# Patient Record
Sex: Female | Born: 1955 | Race: White | Hispanic: No | Marital: Married | Smoking: Former smoker
Health system: Southern US, Community
[De-identification: ages and names within clinical notes are randomized; demographics above are authoritative.]

## PROBLEM LIST (undated history)

## (undated) DIAGNOSIS — R51 Headache: Secondary | ICD-10-CM

## (undated) DIAGNOSIS — R112 Nausea with vomiting, unspecified: Secondary | ICD-10-CM

## (undated) DIAGNOSIS — Z9889 Other specified postprocedural states: Secondary | ICD-10-CM

## (undated) DIAGNOSIS — R011 Cardiac murmur, unspecified: Secondary | ICD-10-CM

## (undated) DIAGNOSIS — T8859XA Other complications of anesthesia, initial encounter: Secondary | ICD-10-CM

## (undated) DIAGNOSIS — G2581 Restless legs syndrome: Secondary | ICD-10-CM

## (undated) DIAGNOSIS — F32A Depression, unspecified: Secondary | ICD-10-CM

## (undated) DIAGNOSIS — D649 Anemia, unspecified: Secondary | ICD-10-CM

## (undated) DIAGNOSIS — K219 Gastro-esophageal reflux disease without esophagitis: Secondary | ICD-10-CM

## (undated) DIAGNOSIS — J449 Chronic obstructive pulmonary disease, unspecified: Secondary | ICD-10-CM

## (undated) DIAGNOSIS — T7840XA Allergy, unspecified, initial encounter: Secondary | ICD-10-CM

## (undated) DIAGNOSIS — A630 Anogenital (venereal) warts: Secondary | ICD-10-CM

## (undated) DIAGNOSIS — R0683 Snoring: Secondary | ICD-10-CM

## (undated) DIAGNOSIS — F419 Anxiety disorder, unspecified: Secondary | ICD-10-CM

## (undated) DIAGNOSIS — F329 Major depressive disorder, single episode, unspecified: Secondary | ICD-10-CM

## (undated) HISTORY — DX: Snoring: R06.83

## (undated) HISTORY — PX: OTHER SURGICAL HISTORY: SHX169

## (undated) HISTORY — DX: Anogenital (venereal) warts: A63.0

## (undated) HISTORY — DX: Gastro-esophageal reflux disease without esophagitis: K21.9

## (undated) HISTORY — DX: Restless legs syndrome: G25.81

## (undated) HISTORY — DX: Chronic obstructive pulmonary disease, unspecified: J44.9

## (undated) HISTORY — PX: COSMETIC SURGERY: SHX468

## (undated) HISTORY — PX: FRACTURE SURGERY: SHX138

## (undated) HISTORY — DX: Allergy, unspecified, initial encounter: T78.40XA

## (undated) HISTORY — DX: Major depressive disorder, single episode, unspecified: F32.9

## (undated) HISTORY — DX: Headache: R51

## (undated) HISTORY — DX: Cardiac murmur, unspecified: R01.1

## (undated) HISTORY — DX: Anxiety disorder, unspecified: F41.9

## (undated) HISTORY — DX: Depression, unspecified: F32.A

## (undated) HISTORY — DX: Anemia, unspecified: D64.9

## (undated) HISTORY — PX: BREAST SURGERY: SHX581

---
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
## 1981-01-27 HISTORY — PX: TUBAL LIGATION: SHX77

## 2005-04-11 ENCOUNTER — Other Ambulatory Visit: Admission: RE | Admit: 2005-04-11 | Discharge: 2005-04-11 | Payer: Self-pay | Admitting: Obstetrics & Gynecology

## 2006-06-01 LAB — HM COLONOSCOPY

## 2007-11-25 ENCOUNTER — Observation Stay (HOSPITAL_COMMUNITY): Admission: EM | Admit: 2007-11-25 | Discharge: 2007-11-26 | Payer: Self-pay | Admitting: Internal Medicine

## 2007-11-25 ENCOUNTER — Encounter: Payer: Self-pay | Admitting: Emergency Medicine

## 2007-11-29 ENCOUNTER — Ambulatory Visit: Payer: Self-pay

## 2010-01-27 LAB — HM PAP SMEAR: HM Pap smear: NORMAL

## 2010-01-27 LAB — HM DIABETES EYE EXAM: HM Diabetic Eye Exam: NORMAL

## 2010-06-11 NOTE — H&P (Signed)
Monica Barnes, SEM                ACCOUNT NO.:  [id number]   MEDICAL RECORD NO.:  [id number]          PATIENT TYPE:  INP   LOCATION:  3705                         FACILITY:  [facility]   PHYSICIAN:  Kerry Hough, MD    DATE OF BIRTH:  May 07, 1955   DATE OF ADMISSION:  11/25/2007  DATE OF DISCHARGE:                              HISTORY & PHYSICAL   CHIEF COMPLAINT:  Chest pain.   HISTORY OF PRESENT ILLNESS:  The patient is a 55 year old white female  with a history of tobacco use, anxiety, and depression, who presents  with substernal chest pain.  She notes she has some associated sweating  and shaking, at the worse this pain with 6/10 and lasted minutes.  She  notes she was able to workout in her normal routine this morning for  approximately 30 minutes on the treadmill without issues of shortness of  breath or chest pain.  She was [place]  [employer], systems [employer] in when she had a sudden onset of chest pain at  4 p.m.  She notes her co-worker gave an aspirin to chew and she sat  down.  Then her boss encouraged her to go with EMS to the outside  hospital ([facility]).  She notes she has had no further pain  at that time or present, she is pain free now.  She notes she has never  had any prior or similar symptoms with either rest or exertion.  She  denies any heart failure symptoms including PND, orthopnea, or dyspnea  on exertion.  She has never had any problems with lower extremity  swelling.   She and her husband both did note that she snores.  Anecdotally, on  review of symptoms, she does note some bright red blood per rectum.  She  says this is only a small amount, only noticed very infrequently.  She  looked this up on the Internet and felt this was not a concern.  She  noticed that this last happened 3 days ago.   PAST MEDICAL HISTORY:  1. Anxiety mostly which she states is due to her job.  2. Depression.  3. History of IUD.   MEDICATIONS:  Prozac unknown dose.  Now, recently started Xanax p.r.n.  which she takes only at work, Systems developer for weight loss which she has been  taking for over a year, multiple vitamins including a multivitamin  daily, vitamin D, E, B, fish oil, iron, and calcium.   ALLERGIES:  PENICILLIN which causes emesis.   SOCIAL HISTORY:  She smokes approximately one-half to one-quarter pack  per day.  Occasional alcohol.  She said there will be nights where she  drinks 3 drinks and many nights where she drinks none.  No other drug  use.  She works as above at the [place].   FAMILY HISTORY:  Mother is living at age 67.  Father died at 57 with  pancreatic cancer.   REVIEW OF SYSTEMS:  A full 14-point review of systems was completed.  Pertinent positives were discussed in the  HPI above.  Of note, she does  note some occasional bright red blood per rectum, but denies any frank  melena.   PHYSICAL EXAMINATION:  VITAL SIGNS:  Blood pressure 152/79 down to  121/76 at present, 59-70 is her pulse, 14-18 is her respiratory rate.  She is afebrile with a temperature maximum 98.1 here.  GENERAL:  No acute distress.  HEENT:  Normocephalic and atraumatic.  Oropharynx clear.  Mucous  membranes moist.  NECK:  No bruit.  No JVD.  LYMPH:  No cervical lymphadenopathy.  CARDIOVASCULAR:  Regular rate and rhythm.  No murmurs, rubs, or gallops.  CHEST:  Clear to auscultation bilaterally.  ABDOMEN:  Soft, nontender, and nondistended.  Normoactive bowel sounds.  EXTREMITIES:  No clubbing, cyanosis, or edema.  Pulses are 2+.  NEUROLOGIC:  Nonfocal.  Strength is 5/5 x4 extremities.  SKIN:  No rash, clean, dry, and intact.  MUSCULOSKELETAL:  No deformities.  Full range of motion.   LABORATORY DATA:  White count is 6.8, hemoglobin is 13.6, and platelet  count is 211.  Sodium is 142, potassium 4.2, chloride 103, bicarb 26,  BUN 16, creatinine 0.7, and glucose 82.  At 18:49, myoglobin was normal.  CK-MB was 1.6 and  troponin was less than 0.05.  EKG revealed normal  sinus rhythm at a rate of 60 with no ST or T-wave changes.  Chest x-ray  was within normal limits.   ASSESSMENT AND PLAN:  A 55 year old white female with anxiety,  depression, positive tobacco use, presents with substernal chest pain.  I am suspicious primarily as a gastrointestinal source, as she seems to  have an atypical story for ACS.  1. Chest pain.  Low risk by history, only noted cardiovascular risk is      her tobacco use.  We will rule out with EKG and serial enzymes.      Continue on telemetry, aspirin 325 mg was given already.  We will      check a fasting lipid profile, hemoglobin A1c.  Exercise stress      test in the morning if her enzymes are negative.  Her blood      pressure is now normal.  If need be, we will add beta-blocker and      ACE inhibitor.  2. Gastrointestinal.  Heme check on her stools.  She does warrant      outpatient followup, likely colonoscopy needed.  3. Anxiety.  Continue with her Prozac and benzodiazepines on a p.r.n.      basis.  4. Prophylaxis.  Proton pump inhibitor and Lovenox.      Kerry Hough, MD  Electronically Signed     PMB/MEDQ  D:  11/26/2007  T:  11/26/2007  Job:  [id number]   cc:   Maisie Fus, M.D.

## 2010-09-03 ENCOUNTER — Ambulatory Visit (INDEPENDENT_AMBULATORY_CARE_PROVIDER_SITE_OTHER): Payer: Managed Care, Other (non HMO) | Admitting: Internal Medicine

## 2010-09-03 ENCOUNTER — Other Ambulatory Visit (INDEPENDENT_AMBULATORY_CARE_PROVIDER_SITE_OTHER): Payer: Managed Care, Other (non HMO)

## 2010-09-03 ENCOUNTER — Encounter: Payer: Self-pay | Admitting: Internal Medicine

## 2010-09-03 VITALS — BP 130/78 | HR 65 | Temp 97.8°F | Resp 16 | Ht 62.0 in | Wt 168.2 lb

## 2010-09-03 DIAGNOSIS — R413 Other amnesia: Secondary | ICD-10-CM | POA: Insufficient documentation

## 2010-09-03 DIAGNOSIS — R0683 Snoring: Secondary | ICD-10-CM

## 2010-09-03 DIAGNOSIS — R51 Headache: Secondary | ICD-10-CM | POA: Insufficient documentation

## 2010-09-03 DIAGNOSIS — R0609 Other forms of dyspnea: Secondary | ICD-10-CM

## 2010-09-03 DIAGNOSIS — G478 Other sleep disorders: Secondary | ICD-10-CM | POA: Insufficient documentation

## 2010-09-03 DIAGNOSIS — R519 Headache, unspecified: Secondary | ICD-10-CM | POA: Insufficient documentation

## 2010-09-03 HISTORY — DX: Snoring: R06.83

## 2010-09-03 LAB — CBC WITH DIFFERENTIAL/PLATELET
Basophils Absolute: 0 10*3/uL (ref 0.0–0.1)
Eosinophils Absolute: 0.2 10*3/uL (ref 0.0–0.7)
HCT: 37.2 % (ref 36.0–46.0)
Hemoglobin: 12.7 g/dL (ref 12.0–15.0)
Lymphs Abs: 0.8 10*3/uL (ref 0.7–4.0)
MCHC: 34.3 g/dL (ref 30.0–36.0)
MCV: 93.5 fl (ref 78.0–100.0)
Monocytes Absolute: 0.4 10*3/uL (ref 0.1–1.0)
Neutro Abs: 5.5 10*3/uL (ref 1.4–7.7)
Platelets: 225 10*3/uL (ref 150.0–400.0)
RDW: 13 % (ref 11.5–14.6)

## 2010-09-03 LAB — COMPREHENSIVE METABOLIC PANEL
ALT: 27 U/L (ref 0–35)
Alkaline Phosphatase: 53 U/L (ref 39–117)
CO2: 26 mEq/L (ref 19–32)
Creatinine, Ser: 0.6 mg/dL (ref 0.4–1.2)
GFR: 109.68 mL/min (ref >60.00)
Total Bilirubin: 0.6 mg/dL (ref 0.3–1.2)

## 2010-09-03 LAB — VITAMIN B12: Vitamin B-12: 725 pg/mL (ref 211–911)

## 2010-09-03 MED ORDER — TOPIRAMATE 25 MG PO TABS: 25.0000 mg | ORAL_TABLET | Freq: Every day | ORAL | Status: DC

## 2010-09-03 NOTE — Progress Notes (Signed)
Subjective:    Patient ID: Monica Barnes, female    DOB: December 16, 1955, 55 y.o.   MRN: [id number]  Headache  This is a new problem. Episode onset: 2 months. The problem occurs intermittently. The problem has been unchanged. The pain is located in the bilateral region. The pain does not radiate. The pain quality is not similar to prior headaches. The quality of the pain is described as aching. The pain is at a severity of 2/10. The pain is mild. Pertinent negatives include no abdominal pain, abnormal behavior, anorexia, back pain, blurred vision, coughing, dizziness, drainage, ear pain, eye pain, eye redness, eye watering, facial sweating, fever, hearing loss, insomnia, loss of balance, muscle aches, nausea, neck pain, numbness, phonophobia, photophobia, rhinorrhea, scalp tenderness, seizures, sinus pressure, sore throat, swollen glands, tingling, tinnitus, visual change, vomiting, weakness or weight loss. The symptoms are aggravated by unknown. She has tried NSAIDs for the symptoms. The treatment provided mild relief. There is no history of cancer, cluster headaches, hypertension, immunosuppression, migraine headaches, migraines in the family, obesity, pseudotumor cerebri, recent head traumas, sinus disease or TMJ.      Review of Systems  Constitutional: Positive for fatigue and unexpected weight change (weight gain). Negative for fever, chills, weight loss, diaphoresis, activity change and appetite change.  HENT: Negative for hearing loss, ear pain, sore throat, facial swelling, rhinorrhea, mouth sores, trouble swallowing, neck pain, neck stiffness, voice change, sinus pressure, tinnitus and ear discharge.   Eyes: Negative for blurred vision, photophobia, pain, discharge, redness, itching and visual disturbance.  Respiratory: Positive for apnea (heavy snoring). Negative for cough, choking, chest tightness, shortness of breath, wheezing and stridor.   Cardiovascular: Negative for chest pain, palpitations  and leg swelling.  Gastrointestinal: Negative for nausea, vomiting, abdominal pain, diarrhea, constipation, blood in stool, abdominal distention, anal bleeding, rectal pain and anorexia.  Genitourinary: Negative for dysuria, urgency, frequency, hematuria, flank pain, enuresis, difficulty urinating and dyspareunia.  Musculoskeletal: Negative for back pain.  Skin: Negative for color change, pallor, rash and wound.  Neurological: Positive for headaches. Negative for dizziness, tingling, tremors, seizures, syncope, facial asymmetry, speech difficulty, weakness, light-headedness, numbness and loss of balance.  Hematological: Negative for adenopathy. Does not bruise/bleed easily.  Psychiatric/Behavioral: Positive for confusion, sleep disturbance and decreased concentration. Negative for suicidal ideas, hallucinations, behavioral problems, self-injury, dysphoric mood and agitation. The patient is nervous/anxious. The patient does not have insomnia.        Objective:   Physical Exam  Vitals reviewed. Constitutional: She is oriented to person, place, and time. She appears well-developed and well-nourished. No distress.  HENT:  Head: Normocephalic and atraumatic.  Right Ear: External ear normal.  Left Ear: External ear normal.  Nose: Nose normal.  Mouth/Throat: Oropharynx is clear and moist. No oropharyngeal exudate.  Eyes: Conjunctivae and EOM are normal. Pupils are equal, round, and reactive to light. Right eye exhibits no discharge. Left eye exhibits no discharge. No scleral icterus.  Neck: Normal range of motion. Neck supple. No JVD present. No tracheal deviation present. No thyromegaly present.  Cardiovascular: Normal rate, regular rhythm, normal heart sounds and intact distal pulses.  Exam reveals no gallop and no friction rub.   No murmur heard. Pulmonary/Chest: Effort normal and breath sounds normal. No stridor. No respiratory distress. She has no wheezes. She has no rales. She exhibits no  tenderness.  Abdominal: Soft. Bowel sounds are normal. She exhibits no distension and no mass. There is no tenderness. There is no rebound and no guarding.  Musculoskeletal: Normal  range of motion. She exhibits no edema and no tenderness.  Lymphadenopathy:    She has no cervical adenopathy.  Neurological: She is alert and oriented to person, place, and time. She has normal strength. She is not disoriented. She displays no atrophy, no tremor and normal reflexes. No cranial nerve deficit or sensory deficit. She exhibits normal muscle tone. She displays a negative Romberg sign. She displays no seizure activity. Coordination and gait normal. She displays no Babinski's sign on the right side. She displays no Babinski's sign on the left side.  Reflex Scores:      Tricep reflexes are 1+ on the right side and 1+ on the left side.      Bicep reflexes are 1+ on the right side and 1+ on the left side.      Brachioradialis reflexes are 1+ on the right side.      Patellar reflexes are 1+ on the right side and 1+ on the left side.      Achilles reflexes are 1+ on the right side and 1+ on the left side. Skin: Skin is warm and dry. No rash noted. She is not diaphoretic. No erythema. No pallor.  Psychiatric: She has a normal mood and affect. Her speech is normal and behavior is normal. Judgment and thought content normal. Cognition and memory are normal.          Assessment & Plan:

## 2010-09-03 NOTE — Assessment & Plan Note (Signed)
I will check some labs today to look for secondary causes of memory loss but I think she needs a sleep evaluation

## 2010-09-03 NOTE — Patient Instructions (Signed)
Headache, General, Without Cause The specific cause of your headache may not have been found today. There are many causes and types of headache. A few common ones are:  Tension headache.  Migraine.   Infections (examples: dental and sinus infections).  Bone and/or joint problems in the neck or jaw.   Depression.  Eye problems.   These headaches are not life threatening.  Headaches can sometimes be diagnosed by a patient history and a physical exam. Sometimes, lab and imaging studies (such as x-ray and/or CT scan) are used to rule out more serious problems. In some cases, a spinal tap (lumbar puncture) may be requested. There are many times when your exam and tests may be normal on the first visit even when there is a serious problem causing your headaches. Because of that, it is very important to follow up with your doctor or local clinic for further evaluation. HOME CARE INSTRUCTIONS  Keep follow-up appointments with your caregiver, or any specialist referral.   Only take over-the-counter or prescription medicines for pain, discomfort, or fever as directed by your caregiver.   Biofeedback, massage, or other relaxation techniques may be helpful.   Ice packs or heat applied to the head and neck can be used. Do this three to four times per day, or as needed.   Call your doctor if you have any questions or concerns.   If you smoke, you should quit.  FINDING OUT THE RESULTS OF TESTS  If a radiology test was performed, a radiologist will review your results.   You will be contacted by the emergency department or your physician if any test results require a change in your treatment plan.   Not all test results may be available during your visit. If your test results are not back during the visit, make an appointment with your caregiver to find out the results. Do not assume everything is normal if you have not heard from your caregiver or the medical facility. It is important for you to  follow up on all of your test results.  SEEK MEDICAL CARE IF:  You develop problems with medications prescribed.   You do not respond to or obtain relief from medications.   You have a change from the usual headache.   You develop nausea or vomiting.  SEEK IMMEDIATE MEDICAL CARE IF:   If your headache becomes severe.   You have an unexplained oral temperature above 100.5, or as your caregiver suggests.   You have a stiff neck.   You have loss of vision.   You have muscular weakness.   You have loss of muscular control.   You develop severe symptoms different from your first symptoms.   You start losing your balance or have trouble walking.   You feel faint or pass out.  MAKE SURE YOU:   Understand these instructions.   Will watch your condition.   Will get help right away if you are not doing well or get worse.  Document Released: 01/13/2005 Document Re-Released: 12/27/2007 Exit[employer] Patient Information 2011 [employer]. 

## 2010-09-03 NOTE — Assessment & Plan Note (Signed)
I am concerned that she has sleep apnea and that is the cause of her headaches so I have asked her to be seen by sleep medicine

## 2010-09-03 NOTE — Assessment & Plan Note (Signed)
She has no localizing symptoms and her exam is normal so I have not ordered an imaging study at this time, I will check labs to look for metabolic causes of her symptoms and await the results of sleep evaluation, in the meantime I will try her on topamax to see if that helps with her headaches

## 2010-09-04 LAB — C-REACTIVE PROTEIN: CRP: 0.5 mg/dL (ref <0.6)

## 2010-09-04 LAB — RPR

## 2010-09-20 ENCOUNTER — Encounter: Payer: Self-pay | Admitting: Pulmonary Disease

## 2010-09-20 ENCOUNTER — Ambulatory Visit (INDEPENDENT_AMBULATORY_CARE_PROVIDER_SITE_OTHER): Payer: Managed Care, Other (non HMO) | Admitting: Pulmonary Disease

## 2010-09-20 VITALS — BP 132/78 | HR 70 | Temp 98.1°F | Ht 62.0 in | Wt 169.0 lb

## 2010-09-20 DIAGNOSIS — G2581 Restless legs syndrome: Secondary | ICD-10-CM

## 2010-09-20 DIAGNOSIS — G4700 Insomnia, unspecified: Secondary | ICD-10-CM

## 2010-09-20 DIAGNOSIS — R0683 Snoring: Secondary | ICD-10-CM

## 2010-09-20 DIAGNOSIS — R0989 Other specified symptoms and signs involving the circulatory and respiratory systems: Secondary | ICD-10-CM

## 2010-09-20 HISTORY — DX: Restless legs syndrome: G25.81

## 2010-09-20 NOTE — Assessment & Plan Note (Signed)
She has snoring, sleep disruption, and daytime sleepiness.  She has a history of depression.  I am concerned she could have sleep apnea.  To further assess will arrange for in lab sleep study.  If she does have sleep apnea, she may be a good candidate for an oral appliance.

## 2010-09-20 NOTE — Assessment & Plan Note (Signed)
She has trouble falling asleep and staying asleep.  Some of this is could be related to sleep apnea.  Some of this is likely related to her mood disorder, and to poor sleep hygiene.  Reviewed proper sleep hygiene techniques.  Will address further after review of her sleep study.

## 2010-09-20 NOTE — Patient Instructions (Signed)
Will schedule sleep test Will call to schedule follow up after sleep test reviewed

## 2010-09-20 NOTE — Assessment & Plan Note (Signed)
She c/o difficulty with legs symptoms that cause trouble falling asleep.  Explained this could be related to sleep apnea.  Will reassess after her sleep study.

## 2010-09-20 NOTE — Progress Notes (Signed)
Subjective:    Patient ID: Monica Barnes, female    DOB: 06/20/1955, 55 y.o.   MRN: [id number]  HPI 55 yo female for sleep evaluation.  She has been feeling tired during the day. She has noticed this for about 3 years.  She has trouble sleeping at night and dreams a lot.  She also snores.  She goes to bed at 1030 pm.  It can take her an hour to fall asleep.  She will be thinking about random thoughts.  She will be woken up when her husband goes to the bathroom, and then will again have trouble falling back to sleep.  She gets out of bed at 1030 am.  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  She does not use anything to help sleep or stay awake.  She gets headaches in the morning.  She does not usually take naps.    She tends to sleep on her side.  She gets funny feeling in her legs several times per week.  This can cause trouble falling asleep.  She does not have leg cramps.  The patient denies sleep walking, sleep talking, bruxism, or nightmares.  There is no history of restless legs.  The patient denies sleep hallucinations, sleep paralysis, or cataplexy.  She has gained about 20 lbs over the past year.  She smokes a few cigarettes per week.  She drinks wine in the evening.  She used to have allergy troubles, but not so much recently.  Epworth score is 4 out of 24.  Past Medical History  Diagnosis Date  . Anemia   . Depression   . Headache   . Heart murmur   . Genital warts   . Asthma      Family History  Problem Relation Age of Onset  . Alcohol abuse Other   . Drug abuse Other   . Throat cancer Father   . Heart disease Maternal Grandmother   . Liver cancer Father   . Alcohol abuse Father   . Hypertension Father   . Emphysema Mother   . Pancreatic cancer Father   . Lung cancer Paternal Uncle   . Prostate cancer Maternal Grandfather      History   Social History  . Marital Status: Married   Occupational History  . 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 Smoking status: Current Everyday Smoker -- 0.3 packs/day for 40 years    Types: Cigarettes  . Alcohol Use: 12.6 oz/week    21 Glasses of wine per week  . Drug Use: No  . Sexually Active: Yes    Birth Control/ Protection: Post-menopausal    Social History Narrative   Caffienated drinks-yesSeat belt use often-yesRegular Exercise-noSmoke alarm in the home-noFirearms/guns in the home-yesHistory of physical abuse-no     Allergies  Allergen Reactions  . Penicillins Nausea And Vomiting    Review of Systems  Constitutional: Positive for appetite change and unexpected weight change.  HENT: Positive for sore throat.   Respiratory: Positive for shortness of breath.   Cardiovascular: Positive for chest pain.  Neurological: Positive for headaches.  Psychiatric/Behavioral: Positive for dysphoric mood.  Heartburn/indigestion      Objective:   Physical Exam  BP 132/78  Pulse 70  Temp(Src) 98.1 F (36.7 C) (Oral)  Ht 5' 2"  (1.575 m)  Wt 169 lb (76.658 kg)  BMI 30.91 kg/m2  SpO2 99%  LMP  01/27/2009  General - Obese HEENT - PERRLA, EOMI, no sinus tenderness, over-bite, MP 3, scalloped tongue border, no LAN, no thyromegaly Cardiac - s1s2 regular, no murmur, pulses symmetric Chest - CTA abd - soft, nontender Ext - no e/c/c Neuro - normal strength, CN intact Psych - anxious Skin - no rashes     Assessment & Plan:   Snoring She has snoring, sleep disruption, and daytime sleepiness.  She has a history of depression.  I am concerned she could have sleep apnea.  To further assess will arrange for in lab sleep study.  If she does have sleep apnea, she may be a good candidate for an oral appliance.  Restless legs She c/o difficulty with legs symptoms that cause trouble falling asleep.  Explained this could be related to sleep apnea.  Will reassess after her sleep study.  Insomnia She has trouble falling asleep and staying asleep.  Some  of this is could be related to sleep apnea.  Some of this is likely related to her mood disorder, and to poor sleep hygiene.  Reviewed proper sleep hygiene techniques.  Will address further after review of her sleep study.    Updated Medication List Outpatient Encounter Prescriptions as of 09/20/2010  Medication Sig Dispense Refill  . ALPRAZolam (XANAX) 0.5 MG tablet Take 0.5 mg by mouth 3 (three) times daily as needed.        . B Complex-C-Folic Acid (STRESS B COMPLEX PO) Once a day       . Carbonyl Iron (PERFECT IRON PO) Once a day       . Cholecalciferol (D3-1000) 1000 UNITS capsule Take 1,000 Units by mouth daily.        . cyanocobalamin 1000 MCG tablet Take 100 mcg by mouth daily.        Marland Kitchen estradiol (ESTRACE) 1 MG tablet Take 1 mg by mouth daily.        Marland Kitchen FLUoxetine (PROZAC) 20 MG capsule Take 20 mg by mouth daily.        Marland Kitchen omeprazole (PRILOSEC OTC) 20 MG tablet Take 20 mg by mouth daily.        Marland Kitchen orlistat (ALLI) 60 MG capsule Take 60 mg by mouth 3 (three) times daily with meals.        Marland Kitchen tetrahydrozoline (VISINE) 0.05 % ophthalmic solution Place 1 drop into both eyes as needed.        . topiramate (TOPAMAX) 25 MG tablet Take 1 tablet (25 mg total) by mouth at bedtime.  30 tablet  2

## 2010-10-09 ENCOUNTER — Ambulatory Visit (HOSPITAL_BASED_OUTPATIENT_CLINIC_OR_DEPARTMENT_OTHER): Payer: Managed Care, Other (non HMO) | Attending: Pulmonary Disease

## 2010-10-09 DIAGNOSIS — R0683 Snoring: Secondary | ICD-10-CM

## 2010-10-09 DIAGNOSIS — Z79899 Other long term (current) drug therapy: Secondary | ICD-10-CM | POA: Insufficient documentation

## 2010-10-09 DIAGNOSIS — G4710 Hypersomnia, unspecified: Secondary | ICD-10-CM | POA: Insufficient documentation

## 2010-10-15 NOTE — Procedures (Signed)
Monica Barnes, Monica Barnes               ACCOUNT NO.:  [id number]  MEDICAL RECORD NO.:  [id number]          PATIENT TYPE:  OUT  LOCATION:  SLEEP CENTER                 FACILITY:  [facility]  PHYSICIAN:  Chesley Mires, MD        DATE OF BIRTH:  March 30, 1955  DATE OF STUDY:  10/09/2010                           NOCTURNAL POLYSOMNOGRAM  REFERRING PHYSICIAN:  Chesley Mires, MD  INDICATIONS:  [name] is a 55 year old female who has a history of depression.  She has difficulty with sleep onset and sleep maintenance insomnia.  She also has symptoms of restless leg syndrome.  She complains of snoring, sleep disruption, and daytime sleepiness.  She is therefore referred to sleep lab for evaluation of hypersomnia and obstructive sleep apnea.  Height is 5 feet 2 inches, weight is 169 pounds.  BMI is 31.  Neck size is 15 inches.  MEDICATIONS:  Xanax, iron, vitamin D, Estrace, Prozac, Prilosec, Visine, and Topamax.  Epworth score is 6.  SLEEP ARCHITECTURE:  Total recording time was 434 minutes.  Total sleep time was 314 minutes.  Sleep efficiency was 70%.  Sleep latency was 65 minutes.  REM latency was 337 minutes.  The study was notable for lack of stage III sleep and the patient slept predominately in the non-supine position.  RESPIRATORY DATA The average respiratory rate was 18.  Occasional snoring was noted by the technician.  The overall apnea/hypopnea index was 0.  However, it appeared that the patient did have truncation of air flow which would be suggestive of upper airway resistance.  OXYGEN DATA:  The baseline oxygenation was 97%.  The oxygen saturation nadir was 91%.  The study was conducted without the use of supplemental oxygen.  CARDIAC DATA:  The average heart rate was 66 and the rhythm strip showed sinus rhythm.  MOVEMENT/PARASOMNIA:  The periodic limb movement index was 92.9 and the patient had no restroom trips.  IMPRESSION:  While this study did not show evidence for  obstructive sleep apnea, she did have truncation of her air flow suggestive of upper airway resistance syndrome.  She had a significant increase in her periodic limb movement index to 92.9.  This would be consistent with her complaints of restless leg syndrome.  In addition to diet, excise, and weight reduction, the patient should have further evaluation of her leg symptoms as this is likely contributing to some degree to her sleep disruption.     Chesley Mires, MD [place], [place] Board of Sleep Medicine    VS/MEDQ  D:  10/15/2010 13:00:18  T:  10/15/2010 21:25:15  Job:  [id number]

## 2010-10-16 ENCOUNTER — Telehealth: Payer: Self-pay | Admitting: Pulmonary Disease

## 2010-10-16 DIAGNOSIS — G4710 Hypersomnia, unspecified: Secondary | ICD-10-CM

## 2010-10-16 DIAGNOSIS — G4730 Sleep apnea, unspecified: Secondary | ICD-10-CM

## 2010-10-16 DIAGNOSIS — G4761 Periodic limb movement disorder: Secondary | ICD-10-CM

## 2010-10-16 NOTE — Telephone Encounter (Signed)
Pt returning call & can be reached at [phone].  Satira Anis

## 2010-10-16 NOTE — Telephone Encounter (Signed)
Pt is coming in tomorrow at 4:30 to discuss these results

## 2010-10-16 NOTE — Telephone Encounter (Signed)
PSG 10/09/10>>AHI 0, SpO2 low 91%, UARS, PLMI 92.9.  Will have my nurse schedule ROV to review results.

## 2010-10-16 NOTE — Telephone Encounter (Signed)
[email or web address]  

## 2010-10-17 ENCOUNTER — Encounter: Payer: Self-pay | Admitting: Pulmonary Disease

## 2010-10-17 ENCOUNTER — Ambulatory Visit (INDEPENDENT_AMBULATORY_CARE_PROVIDER_SITE_OTHER): Payer: Managed Care, Other (non HMO) | Admitting: Pulmonary Disease

## 2010-10-17 ENCOUNTER — Other Ambulatory Visit (INDEPENDENT_AMBULATORY_CARE_PROVIDER_SITE_OTHER): Payer: Managed Care, Other (non HMO)

## 2010-10-17 DIAGNOSIS — G4700 Insomnia, unspecified: Secondary | ICD-10-CM

## 2010-10-17 DIAGNOSIS — R0989 Other specified symptoms and signs involving the circulatory and respiratory systems: Secondary | ICD-10-CM

## 2010-10-17 DIAGNOSIS — R0609 Other forms of dyspnea: Secondary | ICD-10-CM

## 2010-10-17 DIAGNOSIS — D649 Anemia, unspecified: Secondary | ICD-10-CM

## 2010-10-17 DIAGNOSIS — G4710 Hypersomnia, unspecified: Secondary | ICD-10-CM

## 2010-10-17 DIAGNOSIS — R0683 Snoring: Secondary | ICD-10-CM

## 2010-10-17 DIAGNOSIS — G2581 Restless legs syndrome: Secondary | ICD-10-CM

## 2010-10-17 NOTE — Assessment & Plan Note (Signed)
She has symptoms of RLS and PLMI on sleep study was significantly elevated.  This is likely contributing significantly to her sleep troubles.  Will check her iron levels and ferritin to determine if she would benefit from iron supplementation.    If her iron levels are normal, then may need to consider switching from prozac to alternative antidepressant agent.  SSRI medications have been associated with worsening RLS.  May also need to consider adding medication like requip to control her symptoms.

## 2010-10-17 NOTE — Assessment & Plan Note (Signed)
Likely related to sleep disruption from RLS/PLMD and UARS.

## 2010-10-17 NOTE — Progress Notes (Signed)
Subjective:    Patient ID: Monica Barnes, female    DOB: 1955-11-16, 55 y.o.   MRN: [id number]  HPI 55 yo female with sleep disruption.  She is here to review PSG 10/09/10>>AHI 0, SpO2 low 91%, changes of Upper Airway Resistance Syndrome noted, PLMI 92.9.  She continues to have problems with her legs.  Past Medical History  Diagnosis Date  . Anemia   . Depression   . Headache   . Heart murmur   . Genital warts   . Asthma   . Upper airway resistance syndrome 09/03/2010  . Restless legs 09/20/2010     Family History  Problem Relation Age of Onset  . Alcohol abuse Other   . Drug abuse Other   . Throat cancer Father   . Heart disease Maternal Grandmother   . Liver cancer Father   . Alcohol abuse Father   . Hypertension Father   . Emphysema Mother   . Pancreatic cancer Father   . Lung cancer Paternal Uncle   . Prostate cancer Maternal Grandfather      History   Social History  . Marital Status: Married   Occupational History  . CUSTOMER SERV [employer]   Social History Main Topics  . Smoking status: Current Everyday Smoker -- 0.3 packs/day for 40 years    Types: Cigarettes  . Alcohol Use: 12.6 oz/week    21 Glasses of wine per week  . Drug Use: No  . Sexually Active: Yes    Birth Control/ Protection: Post-menopausal    Social History Narrative   Caffienated drinks-yesSeat belt use often-yesRegular Exercise-noSmoke alarm in the home-noFirearms/guns in the home-yesHistory of physical abuse-no     Allergies  Allergen Reactions  . Penicillins Nausea And Vomiting    Review of Systems     Objective:   Physical Exam  BP 138/76  Pulse 88  Temp(Src) 98 F (36.7 C) (Oral)  Ht 5' 2"  (1.575 m)  Wt 166 lb 12.8 oz (75.66 kg)  BMI 30.51 kg/m2  SpO2 98%  LMP 01/27/2009  General - Obese  HEENT - PERRLA, EOMI, no sinus tenderness, over-bite, MP 3, scalloped tongue border, no LAN, no thyromegaly  Cardiac - s1s2 regular, no murmur,  pulses symmetric  Chest - CTA  abd - soft, nontender  Ext - no e/c/c  Neuro - normal strength, CN intact  Psych - anxious  Skin - no rashes     Assessment & Plan:   Restless legs She has symptoms of RLS and PLMI on sleep study was significantly elevated.  This is likely contributing significantly to her sleep troubles.  Will check her iron levels and ferritin to determine if she would benefit from iron supplementation.    If her iron levels are normal, then may need to consider switching from prozac to alternative antidepressant agent.  SSRI medications have been associated with worsening RLS.  May also need to consider adding medication like requip to control her symptoms.  Upper airway resistance syndrome She had evidence for upper airway resistance syndrome, but not obstructive sleep apnea on recent sleep study.  I reviewed her sleep study results.  I explained the importance of weight reduction.    If her symptoms persist she may need to try additional therapy, such as CPAP or oral appliance.  However, insurance coverage for these therapies would be an issue.  Insomnia Likely related to sleep disruption from RLS/PLMD and UARS.  Hypersomnia Related to RLS/PLMD  and UARS.    Updated Medication List Outpatient Encounter Prescriptions as of 10/17/2010  Medication Sig Dispense Refill  . ALPRAZolam (XANAX) 0.5 MG tablet Take 0.5 mg by mouth 3 (three) times daily as needed.        . B Complex-C-Folic Acid (STRESS B COMPLEX PO) Once a day       . Carbonyl Iron (PERFECT IRON PO) Once a day       . Cholecalciferol (D3-1000) 1000 UNITS capsule Take 1,000 Units by mouth daily.        . cyanocobalamin 1000 MCG tablet Take 100 mcg by mouth daily.        Marland Kitchen FLUoxetine (PROZAC) 20 MG capsule Take 20 mg by mouth daily.        Marland Kitchen omeprazole (PRILOSEC OTC) 20 MG tablet Take 20 mg by mouth daily.        Marland Kitchen orlistat (ALLI) 60 MG capsule Take 60 mg by mouth 3 (three) times daily with meals.          Marland Kitchen tetrahydrozoline (VISINE) 0.05 % ophthalmic solution Place 1 drop into both eyes as needed.        . topiramate (TOPAMAX) 25 MG tablet Take 1 tablet (25 mg total) by mouth at bedtime.  30 tablet  2  . DISCONTD: estradiol (ESTRACE) 1 MG tablet Take 1 mg by mouth daily.

## 2010-10-17 NOTE — Progress Notes (Signed)
Addended by: Tommie Sams on: 10/17/2010 04:59 PM   Modules accepted: Orders

## 2010-10-17 NOTE — Assessment & Plan Note (Signed)
Related to RLS/PLMD and UARS.

## 2010-10-17 NOTE — Patient Instructions (Signed)
Lab tests today>>will call with results Follow up in 3 months 

## 2010-10-17 NOTE — Assessment & Plan Note (Signed)
She had evidence for upper airway resistance syndrome, but not obstructive sleep apnea on recent sleep study.  I reviewed her sleep study results.  I explained the importance of weight reduction.    If her symptoms persist she may need to try additional therapy, such as CPAP or oral appliance.  However, insurance coverage for these therapies would be an issue.

## 2010-10-18 LAB — FOLATE: Folate: 16.1 ng/mL (ref >5.9)

## 2010-10-18 LAB — VITAMIN B12: Vitamin B-12: 869 pg/mL (ref 211–911)

## 2010-10-18 LAB — IBC PANEL: Saturation Ratios: 19.3 % — ABNORMAL LOW (ref 20.0–50.0)

## 2010-10-22 ENCOUNTER — Telehealth: Payer: Self-pay | Admitting: Pulmonary Disease

## 2010-10-22 NOTE — Telephone Encounter (Signed)
Anemia panel from 10/17/10 normal.  Left message on pt's voicemail detailing results.  Advised her to d/w PCP about whether she can change anti-depressant regimen to see if this improves RLS/PLMD.  If not, then may need to add dopamine agonist (requip).  Advised her to call back with questions.  Otherwise will review at next ROV.

## 2010-10-29 LAB — DIFFERENTIAL
Basophils Absolute: 0
Basophils Relative: 0
Lymphocytes Relative: 14
Neutro Abs: 5
Neutrophils Relative %: 74

## 2010-10-29 LAB — URINALYSIS, ROUTINE W REFLEX MICROSCOPIC
Nitrite: POSITIVE — AB
Specific Gravity, Urine: 1.022
Urobilinogen, UA: 0.2
pH: 7

## 2010-10-29 LAB — CARDIAC PANEL(CRET KIN+CKTOT+MB+TROPI)
CK, MB: 2.4
CK, MB: 3.2
Relative Index: 1.6
Relative Index: 1.6
Total CK: 151
Total CK: 205 — ABNORMAL HIGH
Troponin I: <0.01
Troponin I: <0.01

## 2010-10-29 LAB — BASIC METABOLIC PANEL
CO2: 26
Calcium: 9.5
Creatinine, Ser: 0.7
GFR calc Af Amer: >60
GFR calc non Af Amer: >60
Glucose, Bld: 82
Sodium: 142

## 2010-10-29 LAB — POCT CARDIAC MARKERS: CKMB, poc: 1.6

## 2010-10-29 LAB — CBC
MCHC: 33.9
RBC: 4.26
RDW: 11.6

## 2010-10-29 LAB — LIPID PANEL
Cholesterol: 185
LDL Cholesterol: 120 — ABNORMAL HIGH
VLDL: 19

## 2010-10-29 LAB — URINE MICROSCOPIC-ADD ON

## 2011-07-04 ENCOUNTER — Other Ambulatory Visit: Payer: Self-pay | Admitting: Internal Medicine

## 2011-11-24 ENCOUNTER — Encounter: Payer: Self-pay | Admitting: Gastroenterology

## 2012-01-01 ENCOUNTER — Encounter: Payer: Self-pay | Admitting: Gastroenterology

## 2012-01-01 ENCOUNTER — Telehealth: Payer: Self-pay | Admitting: *Deleted

## 2012-01-01 ENCOUNTER — Ambulatory Visit (AMBULATORY_SURGERY_CENTER): Payer: Managed Care, Other (non HMO) | Admitting: *Deleted

## 2012-01-01 VITALS — Ht 62.0 in | Wt 169.8 lb

## 2012-01-01 DIAGNOSIS — Z1211 Encounter for screening for malignant neoplasm of colon: Secondary | ICD-10-CM

## 2012-01-01 MED ORDER — NA SULFATE-K SULFATE-MG SULF 17.5-3.13-1.6 GM/177ML PO SOLN: ORAL | Status: DC

## 2012-01-01 NOTE — Progress Notes (Signed)
Pt scheduled for colonoscopy 12/19 with Dr. Deatra Ina.  Last colonoscopy 2008 with Dr. Earlean Shawl. Pt thinks she had polyps but does not know which kind. Release of information form signed and given to Genella Mech, CMA.

## 2012-01-01 NOTE — Telephone Encounter (Signed)
Pt scheduled for colonoscopy 12/19 with Dr. Kaplan.  Last colonoscopy 2008 with Dr. Medoff. Pt thinks she had polyps but does not know which kind. Release of information form signed and given to Robin Stallings, CMA.  

## 2012-01-08 ENCOUNTER — Telehealth: Payer: Self-pay | Admitting: *Deleted

## 2012-01-08 NOTE — Telephone Encounter (Signed)
Records received. On dr Nita Sells desk

## 2012-01-08 NOTE — Telephone Encounter (Signed)
Message copied by Marlowe Kays on Thu Jan 08, 2012  9:31 AM ------      Message from: Mckinley Jewel, AMY L      Created: Thu Jan 08, 2012  9:20 AM       Husband called to see if records received on this pt from Dr. Jennye Boroughs office.      See note.      Can be reached at [phone]      Thanks       Amy :)

## 2012-01-08 NOTE — Telephone Encounter (Signed)
Returned patients call, told him I havent received records yet. Pt to call Dr Aurora Mask office. They did receive my fax of release on the 5th.

## 2012-01-09 NOTE — Telephone Encounter (Signed)
Notes on Dr Nita Sells Desk Review this pts records. Has a colonoscopy with you on 12/19

## 2012-01-12 ENCOUNTER — Telehealth: Payer: Self-pay | Admitting: Gastroenterology

## 2012-01-12 NOTE — Telephone Encounter (Signed)
Pts husband is calling, states pt has a sore throat, cough, and pink eye. They wanted to know if Dr. Arlyce Dice has reviewed the colon report from Dr. Jennye Boroughs office and if she really needs to have the colon done on Thursday. Please advise.

## 2012-01-12 NOTE — Telephone Encounter (Signed)
Pts husband called back wanting to know if she needs to have the colon done. Please advise. See note below.

## 2012-01-12 NOTE — Telephone Encounter (Signed)
2008 colonoscopy was negative. His family history is negative for colorectal cancer she can wait until 2018

## 2012-01-12 NOTE — Telephone Encounter (Signed)
Spoke with husband and he is aware, appt cancelled.

## 2012-01-15 ENCOUNTER — Encounter: Payer: Managed Care, Other (non HMO) | Admitting: Gastroenterology

## 2012-06-09 LAB — HM MAMMOGRAPHY: HM Mammogram: NORMAL

## 2012-06-09 LAB — HM PAP SMEAR: HM Pap smear: NORMAL

## 2013-01-07 ENCOUNTER — Encounter: Payer: Managed Care, Other (non HMO) | Admitting: Internal Medicine

## 2013-01-12 LAB — HM DEXA SCAN

## 2013-01-14 ENCOUNTER — Encounter: Payer: Self-pay | Admitting: Internal Medicine

## 2013-01-14 ENCOUNTER — Ambulatory Visit (INDEPENDENT_AMBULATORY_CARE_PROVIDER_SITE_OTHER): Payer: BC Managed Care – PPO | Admitting: Internal Medicine

## 2013-01-14 ENCOUNTER — Other Ambulatory Visit (INDEPENDENT_AMBULATORY_CARE_PROVIDER_SITE_OTHER): Payer: BC Managed Care – PPO

## 2013-01-14 VITALS — BP 132/88 | HR 74 | Temp 98.0°F | Resp 16 | Ht 62.0 in | Wt 181.0 lb

## 2013-01-14 DIAGNOSIS — Z0000 Encounter for general adult medical examination without abnormal findings: Secondary | ICD-10-CM

## 2013-01-14 DIAGNOSIS — M19049 Primary osteoarthritis, unspecified hand: Secondary | ICD-10-CM | POA: Insufficient documentation

## 2013-01-14 DIAGNOSIS — J45909 Unspecified asthma, uncomplicated: Secondary | ICD-10-CM

## 2013-01-14 DIAGNOSIS — E669 Obesity, unspecified: Secondary | ICD-10-CM | POA: Insufficient documentation

## 2013-01-14 DIAGNOSIS — M19041 Primary osteoarthritis, right hand: Secondary | ICD-10-CM

## 2013-01-14 LAB — URINALYSIS, ROUTINE W REFLEX MICROSCOPIC
Nitrite: NEGATIVE
Total Protein, Urine: NEGATIVE
pH: 6 (ref 5.0–8.0)

## 2013-01-14 LAB — COMPREHENSIVE METABOLIC PANEL
AST: 36 U/L (ref 0–37)
Albumin: 4.2 g/dL (ref 3.5–5.2)
Alkaline Phosphatase: 79 U/L (ref 39–117)
BUN: 17 mg/dL (ref 6–23)
Calcium: 9.2 mg/dL (ref 8.4–10.5)
Chloride: 107 mEq/L (ref 96–112)
Potassium: 3.9 mEq/L (ref 3.5–5.1)
Sodium: 140 mEq/L (ref 135–145)
Total Protein: 7.2 g/dL (ref 6.0–8.3)

## 2013-01-14 LAB — CBC WITH DIFFERENTIAL/PLATELET
Basophils Absolute: 0 10*3/uL (ref 0.0–0.1)
Basophils Relative: 0.3 % (ref 0.0–3.0)
Eosinophils Absolute: 0.4 10*3/uL (ref 0.0–0.7)
Lymphocytes Relative: 17.5 % (ref 12.0–46.0)
MCHC: 34 g/dL (ref 30.0–36.0)
MCV: 91.4 fl (ref 78.0–100.0)
Monocytes Absolute: 0.5 10*3/uL (ref 0.1–1.0)
Neutrophils Relative %: 69.1 % (ref 43.0–77.0)
Platelets: 219 10*3/uL (ref 150.0–400.0)
RBC: 4.15 Mil/uL (ref 3.87–5.11)
RDW: 13 % (ref 11.5–14.6)

## 2013-01-14 LAB — TSH: TSH: 1.36 u[IU]/mL (ref 0.35–5.50)

## 2013-01-14 LAB — LDL CHOLESTEROL, DIRECT: Direct LDL: 137.9 mg/dL

## 2013-01-14 LAB — LIPID PANEL: VLDL: 52.2 mg/dL — ABNORMAL HIGH (ref 0.0–40.0)

## 2013-01-14 MED ORDER — LORCASERIN HCL 10 MG PO TABS: 1.0000 | ORAL_TABLET | Freq: Two times a day (BID) | ORAL | Status: DC

## 2013-01-14 MED ORDER — BUDESONIDE-FORMOTEROL FUMARATE 80-4.5 MCG/ACT IN AERO: 2.0000 | INHALATION_SPRAY | Freq: Two times a day (BID) | RESPIRATORY_TRACT | Status: DC

## 2013-01-14 NOTE — Assessment & Plan Note (Signed)
She will start symbicort for this

## 2013-01-14 NOTE — Progress Notes (Signed)
Subjective:    Patient ID: Monica Barnes, female    DOB: Feb 26, 1955, 57 y.o.   MRN: [id number]  Asthma She complains of wheezing. There is no chest tightness, cough, difficulty breathing, frequent throat clearing, hemoptysis, hoarse voice, shortness of breath or sputum production. This is a chronic problem. The current episode started more than 1 year ago. The problem occurs intermittently. The problem has been unchanged. Pertinent negatives include no appetite change, chest pain, dyspnea on exertion, ear congestion, ear pain, fever, headaches, heartburn, malaise/fatigue, myalgias, nasal congestion, orthopnea, PND, postnasal drip, rhinorrhea, sneezing, sore throat, sweats, trouble swallowing or weight loss. Her symptoms are aggravated by change in weather. Her symptoms are alleviated by nothing. Her past medical history is significant for asthma.      Review of Systems  Constitutional: Positive for unexpected weight change (weight gain). Negative for fever, chills, weight loss, malaise/fatigue, diaphoresis, activity change, appetite change and fatigue.  HENT: Negative.  Negative for ear pain, hoarse voice, postnasal drip, rhinorrhea, sneezing, sore throat, trouble swallowing and voice change.   Eyes: Negative.   Respiratory: Positive for wheezing. Negative for apnea, cough, hemoptysis, sputum production, choking, chest tightness and shortness of breath.   Cardiovascular: Negative.  Negative for chest pain, dyspnea on exertion, palpitations, leg swelling and PND.  Gastrointestinal: Negative.  Negative for heartburn and abdominal pain.  Endocrine: Negative.   Genitourinary: Negative.   Musculoskeletal: Positive for arthralgias (right hand pain (1st metacarpal)). Negative for back pain, gait problem, joint swelling, myalgias, neck pain and neck stiffness.  Skin: Negative.   Allergic/Immunologic: Negative.   Neurological: Negative.  Negative for headaches.  Hematological: Negative.  Negative for  adenopathy. Does not bruise/bleed easily.  Psychiatric/Behavioral: Negative.  Negative for sleep disturbance, dysphoric mood and decreased concentration. The patient is not nervous/anxious.        Objective:   Physical Exam  Vitals reviewed. Constitutional: She is oriented to person, place, and time. She appears well-developed and well-nourished. No distress.  HENT:  Head: Normocephalic and atraumatic.  Mouth/Throat: Oropharynx is clear and moist. No oropharyngeal exudate.  Eyes: Conjunctivae are normal. Right eye exhibits no discharge. Left eye exhibits no discharge. No scleral icterus.  Neck: Normal range of motion. Neck supple. No JVD present. No tracheal deviation present. No thyromegaly present.  Cardiovascular: Normal rate, regular rhythm, normal heart sounds and intact distal pulses.  Exam reveals no gallop and no friction rub.   No murmur heard. Pulmonary/Chest: Effort normal. No accessory muscle usage or stridor. Not tachypneic. No respiratory distress. She has no decreased breath sounds. She has wheezes in the right lower field and the left lower field. She has no rhonchi. She has no rales. She exhibits no tenderness.  Abdominal: Soft. Bowel sounds are normal. She exhibits no distension and no mass. There is no tenderness. There is no rebound and no guarding.  Musculoskeletal: Normal range of motion. She exhibits no edema and no tenderness.       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 She exhibits normal range of motion, no tenderness, no bony tenderness, normal two-point discrimination, normal capillary refill, no laceration and no swelling. Normal sensation noted. Decreased sensation is not present in the ulnar distribution, is not present in the medial distribution and is not present in the radial distribution. Normal strength noted.  Lymphadenopathy:    She has no cervical adenopathy.  Neurological: She is oriented to person, place, and time.  Skin:  Skin is warm and dry. No  rash noted. She is not diaphoretic. No erythema. No pallor.  Psychiatric: She has a normal mood and affect. Her behavior is normal. Judgment and thought content normal.     Lab Results  Component Value Date   WBC 7.0 09/03/2010   HGB 12.7 09/03/2010   HCT 37.2 09/03/2010   PLT 225.0 09/03/2010   GLUCOSE 101* 09/03/2010   CHOL  Value: 185        ATP III CLASSIFICATION:  <200     mg/dL   Desirable  454-098  mg/dL   Borderline High  >=119    mg/dL   High 14/78/2956   TRIG 94 11/26/2007   HDL 46 11/26/2007   LDLCALC  Value: 120        Total Cholesterol/HDL:CHD Risk Coronary Heart Disease Risk Table                     Men   Women  1/2 Average Risk   3.4   3.3* 11/26/2007   ALT 27 09/03/2010   AST 30 09/03/2010   NA 139 09/03/2010   K 4.1 09/03/2010   CL 104 09/03/2010   CREATININE 0.6 09/03/2010   BUN 20 09/03/2010   CO2 26 09/03/2010   TSH 1.11 09/03/2010   HGBA1C  Value: 5.6 (NOTE)   The ADA recommends the following therapeutic goal for glycemic   control related to Hgb A1C measurement:   Goal of Therapy:   < 7.0% Hgb A1C   Reference: American Diabetes Association: Clinical Practice   Recommendations 2008, Diabetes Care,  2008, 31:(Suppl 1). 11/26/2007        Assessment & Plan:

## 2013-01-14 NOTE — Assessment & Plan Note (Signed)
She will start treating this with tylenol

## 2013-01-14 NOTE — Patient Instructions (Signed)
Preventive Care for Adults, Female A healthy lifestyle and preventive care can promote health and wellness. Preventive health guidelines for women include the following key practices.  A routine yearly physical is a good way to check with your caregiver about your health and preventive screening. It is a chance to share any concerns and updates on your health, and to receive a thorough exam.  Visit your dentist for a routine exam and preventive care every 6 months. Brush your teeth twice a day and floss once a day. Good oral hygiene prevents tooth decay and gum disease.  The frequency of eye exams is based on your age, health, family medical history, use of contact lenses, and other factors. Follow your caregiver's recommendations for frequency of eye exams.  Eat a healthy diet. Foods like vegetables, fruits, whole grains, low-fat dairy products, and lean protein foods contain the nutrients you need without too many calories. Decrease your intake of foods high in solid fats, added sugars, and salt. Eat the right amount of calories for you.Get information about a proper diet from your caregiver, if necessary.  Regular physical exercise is one of the most important things you can do for your health. Most adults should get at least 150 minutes of moderate-intensity exercise (any activity that increases your heart rate and causes you to sweat) each week. In addition, most adults need muscle-strengthening exercises on 2 or more days a week.  Maintain a healthy weight. The body mass index (BMI) is a screening tool to identify possible weight problems. It provides an estimate of body fat based on height and weight. Your caregiver can help determine your BMI, and can help you achieve or maintain a healthy weight.For adults 20 years and older:  A BMI below 18.5 is considered underweight.  A BMI of 18.5 to 24.9 is normal.  A BMI of 25 to 29.9 is considered overweight.  A BMI of 30 and above is  considered obese.  Maintain normal blood lipids and cholesterol levels by exercising and minimizing your intake of saturated fat. Eat a balanced diet with plenty of fruit and vegetables. Blood tests for lipids and cholesterol should begin at age 20 and be repeated every 5 years. If your lipid or cholesterol levels are high, you are over 50, or you are at high risk for heart disease, you may need your cholesterol levels checked more frequently.Ongoing high lipid and cholesterol levels should be treated with medicines if diet and exercise are not effective.  If you smoke, find out from your caregiver how to quit. If you do not use tobacco, do not start.  Lung cancer screening is recommended for adults aged 55 80 years who are at high risk for developing lung cancer because of a history of smoking. Yearly low-dose computed tomography (CT) is recommended for people who have at least a 30-pack-year history of smoking and are a current smoker or have quit within the past 15 years. A pack year of smoking is smoking an average of 1 pack of cigarettes a day for 1 year (for example: 1 pack a day for 30 years or 2 packs a day for 15 years). Yearly screening should continue until the smoker has stopped smoking for at least 15 years. Yearly screening should also be stopped for people who develop a health problem that would prevent them from having lung cancer treatment.  If you are pregnant, do not drink alcohol. If you are breastfeeding, be very cautious about drinking alcohol. If you are   not pregnant and choose to drink alcohol, do not exceed 1 drink per day. One drink is considered to be 12 ounces (355 mL) of beer, 5 ounces (148 mL) of wine, or 1.5 ounces (44 mL) of liquor.  Avoid use of street drugs. Do not share needles with anyone. Ask for help if you need support or instructions about stopping the use of drugs.  High blood pressure causes heart disease and increases the risk of stroke. Your blood pressure  should be checked at least every 1 to 2 years. Ongoing high blood pressure should be treated with medicines if weight loss and exercise are not effective.  If you are 55 to 58 years old, ask your caregiver if you should take aspirin to prevent strokes.  Diabetes screening involves taking a blood sample to check your fasting blood sugar level. This should be done once every 3 years, after age 45, if you are within normal weight and without risk factors for diabetes. Testing should be considered at a younger age or be carried out more frequently if you are overweight and have at least 1 risk factor for diabetes.  Breast cancer screening is essential preventive care for women. You should practice "breast self-awareness." This means understanding the normal appearance and feel of your breasts and may include breast self-examination. Any changes detected, no matter how small, should be reported to a caregiver. Women in their 20s and 30s should have a clinical breast exam (CBE) by a caregiver as part of a regular health exam every 1 to 3 years. After age 40, women should have a CBE every year. Starting at age 40, women should consider having a mammography (breast X-ray test) every year. Women who have a family history of breast cancer should talk to their caregiver about genetic screening. Women at a high risk of breast cancer should talk to their caregivers about having magnetic resonance imaging (MRI) and a mammography every year.  Breast cancer gene (BRCA)-related cancer risk assessment is recommended for women who have family members with BRCA-related cancers. BRCA-related cancers include breast, ovarian, tubal, and peritoneal cancers. Having family members with these cancers may be associated with an increased risk for harmful changes (mutations) in the breast cancer genes BRCA1 and BRCA2. Results of the assessment will determine the need for genetic counseling and BRCA1 and BRCA2 testing.  The Pap test is  a screening test for cervical cancer. A Pap test can show cell changes on the cervix that might become cervical cancer if left untreated. A Pap test is a procedure in which cells are obtained and examined from the lower end of the uterus (cervix).  Women should have a Pap test starting at age 21.  Between ages 21 and 29, Pap tests should be repeated every 2 years.  Beginning at age 30, you should have a Pap test every 3 years as long as the past 3 Pap tests have been normal.  Some women have medical problems that increase the chance of getting cervical cancer. Talk to your caregiver about these problems. It is especially important to talk to your caregiver if a new problem develops soon after your last Pap test. In these cases, your caregiver may recommend more frequent screening and Pap tests.  The above recommendations are the same for women who have or have not gotten the vaccine for human papillomavirus (HPV).  If you had a hysterectomy for a problem that was not cancer or a condition that could lead to cancer, then   you no longer need Pap tests. Even if you no longer need a Pap test, a regular exam is a good idea to make sure no other problems are starting.  If you are between ages 79 and 19, and you have had normal Pap tests going back 10 years, you no longer need Pap tests. Even if you no longer need a Pap test, a regular exam is a good idea to make sure no other problems are starting.  If you have had past treatment for cervical cancer or a condition that could lead to cancer, you need Pap tests and screening for cancer for at least 20 years after your treatment.  If Pap tests have been discontinued, risk factors (such as a new sexual partner) need to be reassessed to determine if screening should be resumed.  The HPV test is an additional test that may be used for cervical cancer screening. The HPV test looks for the virus that can cause the cell changes on the cervix. The cells collected  during the Pap test can be tested for HPV. The HPV test could be used to screen women aged 90 years and older, and should be used in women of any age who have unclear Pap test results. After the age of 9, women should have HPV testing at the same frequency as a Pap test.  Colorectal cancer can be detected and often prevented. Most routine colorectal cancer screening begins at the age of 63 and continues through age 61. However, your caregiver may recommend screening at an earlier age if you have risk factors for colon cancer. On a yearly basis, your caregiver may provide home test kits to check for hidden blood in the stool. Use of a small camera at the end of a tube, to directly examine the colon (sigmoidoscopy or colonoscopy), can detect the earliest forms of colorectal cancer. Talk to your caregiver about this at age 32, when routine screening begins. Direct examination of the colon should be repeated every 5 to 10 years through age 7, unless early forms of pre-cancerous polyps or small growths are found.  Hepatitis C blood testing is recommended for all people born from [birth year] through 1965 and any individual with known risks for hepatitis C.  Practice safe sex. Use condoms and avoid high-risk sexual practices to reduce the spread of sexually transmitted infections (STIs). STIs include gonorrhea, chlamydia, syphilis, trichomonas, herpes, HPV, and human immunodeficiency virus (HIV). Herpes, HIV, and HPV are viral illnesses that have no cure. They can result in disability, cancer, and death. Sexually active women aged 7 and younger should be checked for chlamydia. Older women with new or multiple partners should also be tested for chlamydia. Testing for other STIs is recommended if you are sexually active and at increased risk.  Osteoporosis is a disease in which the bones lose minerals and strength with aging. This can result in serious bone fractures. The risk of osteoporosis can be identified using a  bone density scan. Women ages 30 and over and women at risk for fractures or osteoporosis should discuss screening with their caregivers. Ask your caregiver whether you should take a calcium supplement or vitamin D to reduce the rate of osteoporosis.  Menopause can be associated with physical symptoms and risks. Hormone replacement therapy is available to decrease symptoms and risks. You should talk to your caregiver about whether hormone replacement therapy is right for you.  Use sunscreen. Apply sunscreen liberally and repeatedly throughout the day. You should seek shade  when your shadow is shorter than you. Protect yourself by wearing long sleeves, pants, a wide-brimmed hat, and sunglasses year round, whenever you are outdoors.  Once a month, do a whole body skin exam, using a mirror to look at the skin on your back. Notify your caregiver of new moles, moles that have irregular borders, moles that are larger than a pencil eraser, or moles that have changed in shape or color.  Stay current with required immunizations.  Influenza vaccine. All adults should be immunized every year.  Tetanus, diphtheria, and acellular pertussis (Td, Tdap) vaccine. Pregnant women should receive 1 dose of Tdap vaccine during each pregnancy. The dose should be obtained regardless of the length of time since the last dose. Immunization is preferred during the 27th to 36th week of gestation. An adult who has not previously received Tdap or who does not know her vaccine status should receive 1 dose of Tdap. This initial dose should be followed by tetanus and diphtheria toxoids (Td) booster doses every 10 years. Adults with an unknown or incomplete history of completing a 3-dose immunization series with Td-containing vaccines should begin or complete a primary immunization series including a Tdap dose. Adults should receive a Td booster every 10 years.  Varicella vaccine. An adult without evidence of immunity to varicella  should receive 2 doses or a second dose if she has previously received 1 dose. Pregnant females who do not have evidence of immunity should receive the first dose after pregnancy. This first dose should be obtained before leaving the health care facility. The second dose should be obtained 4 8 weeks after the first dose.  Human papillomavirus (HPV) vaccine. Females aged 13 26 years who have not received the vaccine previously should obtain the 3-dose series. The vaccine is not recommended for use in pregnant females. However, pregnancy testing is not needed before receiving a dose. If a female is found to be pregnant after receiving a dose, no treatment is needed. In that case, the remaining doses should be delayed until after the pregnancy. Immunization is recommended for any person with an immunocompromised condition through the age of 26 years if she did not get any or all doses earlier. During the 3-dose series, the second dose should be obtained 4 8 weeks after the first dose. The third dose should be obtained 24 weeks after the first dose and 16 weeks after the second dose.  Zoster vaccine. One dose is recommended for adults aged 60 years or older unless certain conditions are present.  Measles, mumps, and rubella (MMR) vaccine. Adults born before 1957 generally are considered immune to measles and mumps. Adults born in 1957 or later should have 1 or more doses of MMR vaccine unless there is a contraindication to the vaccine or there is laboratory evidence of immunity to each of the three diseases. A routine second dose of MMR vaccine should be obtained at least 28 days after the first dose for students attending postsecondary schools, health care workers, or international travelers. People who received inactivated measles vaccine or an unknown type of measles vaccine during 1963 1967 should receive 2 doses of MMR vaccine. People who received inactivated mumps vaccine or an unknown type of mumps vaccine  before 1979 and are at high risk for mumps infection should consider immunization with 2 doses of MMR vaccine. For females of childbearing age, rubella immunity should be determined. If there is no evidence of immunity, females who are not pregnant should be vaccinated. If there   is no evidence of immunity, females who are pregnant should delay immunization until after pregnancy. Unvaccinated health care workers born before 1957 who lack laboratory evidence of measles, mumps, or rubella immunity or laboratory confirmation of disease should consider measles and mumps immunization with 2 doses of MMR vaccine or rubella immunization with 1 dose of MMR vaccine.  Pneumococcal 13-valent conjugate (PCV13) vaccine. When indicated, a person who is uncertain of her immunization history and has no record of immunization should receive the PCV13 vaccine. An adult aged 19 years or older who has certain medical conditions and has not been previously immunized should receive 1 dose of PCV13 vaccine. This PCV13 should be followed with a dose of pneumococcal polysaccharide (PPSV23) vaccine. The PPSV23 vaccine dose should be obtained at least 8 weeks after the dose of PCV13 vaccine. An adult aged 19 years or older who has certain medical conditions and previously received 1 or more doses of PPSV23 vaccine should receive 1 dose of PCV13. The PCV13 vaccine dose should be obtained 1 or more years after the last PPSV23 vaccine dose.  Pneumococcal polysaccharide (PPSV23) vaccine. When PCV13 is also indicated, PCV13 should be obtained first. All adults aged 65 years and older should be immunized. An adult younger than age 65 years who has certain medical conditions should be immunized. Any person who resides in a nursing home or long-term care facility should be immunized. An adult smoker should be immunized. People with an immunocompromised condition and certain other conditions should receive both PCV13 and PPSV23 vaccines. People  with human immunodeficiency virus (HIV) infection should be immunized as soon as possible after diagnosis. Immunization during chemotherapy or radiation therapy should be avoided. Routine use of PPSV23 vaccine is not recommended for American Indians, [place] Natives, or people younger than 65 years unless there are medical conditions that require PPSV23 vaccine. When indicated, people who have unknown immunization and have no record of immunization should receive PPSV23 vaccine. One-time revaccination 5 years after the first dose of PPSV23 is recommended for people aged 19 64 years who have chronic kidney failure, nephrotic syndrome, asplenia, or immunocompromised conditions. People who received 1 2 doses of PPSV23 before age 65 years should receive another dose of PPSV23 vaccine at age 65 years or later if at least 5 years have passed since the previous dose. Doses of PPSV23 are not needed for people immunized with PPSV23 at or after age 65 years.  Meningococcal vaccine. Adults with asplenia or persistent complement component deficiencies should receive 2 doses of quadrivalent meningococcal conjugate (MenACWY-D) vaccine. The doses should be obtained at least 2 months apart. Microbiologists working with certain meningococcal bacteria, military recruits, people at risk during an outbreak, and people who travel to or live in countries with a high rate of meningitis should be immunized. A first-year college student up through age 21 years who is living in a residence hall should receive a dose if she did not receive a dose on or after her 16th birthday. Adults who have certain high-risk conditions should receive one or more doses of vaccine.  Hepatitis A vaccine. Adults who wish to be protected from this disease, have certain high-risk conditions, work with hepatitis A-infected animals, work in hepatitis A research labs, or travel to or work in countries with a high rate of hepatitis A should be immunized. Adults  who were previously unvaccinated and who anticipate close contact with an international adoptee during the first 60 days after arrival in the [place] from a country   with a high rate of hepatitis A should be immunized.  Hepatitis B vaccine. Adults who wish to be protected from this disease, have certain high-risk conditions, may be exposed to blood or other infectious body fluids, are household contacts or sex partners of hepatitis B positive people, are clients or workers in certain care facilities, or travel to or work in countries with a high rate of hepatitis B should be immunized.  Haemophilus influenzae type b (Hib) vaccine. A previously unvaccinated person with asplenia or sickle cell disease or having a scheduled splenectomy should receive 1 dose of Hib vaccine. Regardless of previous immunization, a recipient of a hematopoietic stem cell transplant should receive a 3-dose series 6 12 months after her successful transplant. Hib vaccine is not recommended for adults with HIV infection. Preventive Services / Frequency Ages 19 to 39  Blood pressure check.** / Every 1 to 2 years.  Lipid and cholesterol check.** / Every 5 years beginning at age 20.  Clinical breast exam.** / Every 3 years for women in their 20s and 30s.  BRCA-related cancer risk assessment.** / For women who have family members with a BRCA-related cancer (breast, ovarian, tubal, or peritoneal cancers).  Pap test.** / Every 2 years from ages 21 through 29. Every 3 years starting at age 30 through age 65 or 70 with a history of 3 consecutive normal Pap tests.  HPV screening.** / Every 3 years from ages 30 through ages 65 to 70 with a history of 3 consecutive normal Pap tests.  Hepatitis C blood test.** / For any individual with known risks for hepatitis C.  Skin self-exam. / Monthly.  Influenza vaccine. / Every year.  Tetanus, diphtheria, and acellular pertussis (Tdap, Td) vaccine.** / Consult your caregiver. Pregnant  women should receive 1 dose of Tdap vaccine during each pregnancy. 1 dose of Td every 10 years.  Varicella vaccine.** / Consult your caregiver. Pregnant females who do not have evidence of immunity should receive the first dose after pregnancy.  HPV vaccine. / 3 doses over 6 months, if 26 and younger. The vaccine is not recommended for use in pregnant females. However, pregnancy testing is not needed before receiving a dose.  Measles, mumps, rubella (MMR) vaccine.** / You need at least 1 dose of MMR if you were born in 1957 or later. You may also need a 2nd dose. For females of childbearing age, rubella immunity should be determined. If there is no evidence of immunity, females who are not pregnant should be vaccinated. If there is no evidence of immunity, females who are pregnant should delay immunization until after pregnancy.  Pneumococcal 13-valent conjugate (PCV13) vaccine.** / Consult your caregiver.  Pneumococcal polysaccharide (PPSV23) vaccine.** / 1 to 2 doses if you smoke cigarettes or if you have certain conditions.  Meningococcal vaccine.** / 1 dose if you are age 19 to 21 years and a first-year college student living in a residence hall, or have one of several medical conditions, you need to get vaccinated against meningococcal disease. You may also need additional booster doses.  Hepatitis A vaccine.** / Consult your caregiver.  Hepatitis B vaccine.** / Consult your caregiver.  Haemophilus influenzae type b (Hib) vaccine.** / Consult your caregiver. Ages 40 to 64  Blood pressure check.** / Every 1 to 2 years.  Lipid and cholesterol check.** / Every 5 years beginning at age 20.  Lung cancer screening. / Every year if you are aged 55 80 years and have a 30-pack-year history of smoking and   currently smoke or have quit within the past 15 years. Yearly screening is stopped once you have quit smoking for at least 15 years or develop a health problem that would prevent you from having  lung cancer treatment.  Clinical breast exam.** / Every year after age 40.  BRCA-related cancer risk assessment.** / For women who have family members with a BRCA-related cancer (breast, ovarian, tubal, or peritoneal cancers).  Mammogram.** / Every year beginning at age 40 and continuing for as long as you are in good health. Consult with your caregiver.  Pap test.** / Every 3 years starting at age 30 through age 65 or 70 with a history of 3 consecutive normal Pap tests.  HPV screening.** / Every 3 years from ages 30 through ages 65 to 70 with a history of 3 consecutive normal Pap tests.  Fecal occult blood test (FOBT) of stool. / Every year beginning at age 50 and continuing until age 75. You may not need to do this test if you get a colonoscopy every 10 years.  Flexible sigmoidoscopy or colonoscopy.** / Every 5 years for a flexible sigmoidoscopy or every 10 years for a colonoscopy beginning at age 50 and continuing until age 75.  Hepatitis C blood test.** / For all people born from 1945 through 1965 and any individual with known risks for hepatitis C.  Skin self-exam. / Monthly.  Influenza vaccine. / Every year.  Tetanus, diphtheria, and acellular pertussis (Tdap/Td) vaccine.** / Consult your caregiver. Pregnant women should receive 1 dose of Tdap vaccine during each pregnancy. 1 dose of Td every 10 years.  Varicella vaccine.** / Consult your caregiver. Pregnant females who do not have evidence of immunity should receive the first dose after pregnancy.  Zoster vaccine.** / 1 dose for adults aged 60 years or older.  Measles, mumps, rubella (MMR) vaccine.** / You need at least 1 dose of MMR if you were born in 1957 or later. You may also need a 2nd dose. For females of childbearing age, rubella immunity should be determined. If there is no evidence of immunity, females who are not pregnant should be vaccinated. If there is no evidence of immunity, females who are pregnant should delay  immunization until after pregnancy.  Pneumococcal 13-valent conjugate (PCV13) vaccine.** / Consult your caregiver.  Pneumococcal polysaccharide (PPSV23) vaccine.** / 1 to 2 doses if you smoke cigarettes or if you have certain conditions.  Meningococcal vaccine.** / Consult your caregiver.  Hepatitis A vaccine.** / Consult your caregiver.  Hepatitis B vaccine.** / Consult your caregiver.  Haemophilus influenzae type b (Hib) vaccine.** / Consult your caregiver. Ages 65 and over  Blood pressure check.** / Every 1 to 2 years.  Lipid and cholesterol check.** / Every 5 years beginning at age 20.  Lung cancer screening. / Every year if you are aged 55 80 years and have a 30-pack-year history of smoking and currently smoke or have quit within the past 15 years. Yearly screening is stopped once you have quit smoking for at least 15 years or develop a health problem that would prevent you from having lung cancer treatment.  Clinical breast exam.** / Every year after age 40.  BRCA-related cancer risk assessment.** / For women who have family members with a BRCA-related cancer (breast, ovarian, tubal, or peritoneal cancers).  Mammogram.** / Every year beginning at age 40 and continuing for as long as you are in good health. Consult with your caregiver.  Pap test.** / Every 3 years starting at age   30 through age 53 or 59 with a 3 consecutive normal Pap tests. Testing can be stopped between 65 and 70 with 3 consecutive normal Pap tests and no abnormal Pap or HPV tests in the past 10 years.  HPV screening.** / Every 3 years from ages 88 through ages 39 or 84 with a history of 3 consecutive normal Pap tests. Testing can be stopped between 65 and 70 with 3 consecutive normal Pap tests and no abnormal Pap or HPV tests in the past 10 years.  Fecal occult blood test (FOBT) of stool. / Every year beginning at age 89 and continuing until age 36. You may not need to do this test if you get a colonoscopy  every 10 years.  Flexible sigmoidoscopy or colonoscopy.** / Every 5 years for a flexible sigmoidoscopy or every 10 years for a colonoscopy beginning at age 70 and continuing until age 34.  Hepatitis C blood test.** / For all people born from [birth year] through 1966 and any individual with known risks for hepatitis C.  Osteoporosis screening.** / A one-time screening for women ages 89 and over and women at risk for fractures or osteoporosis.  Skin self-exam. / Monthly.  Influenza vaccine. / Every year.  Tetanus, diphtheria, and acellular pertussis (Tdap/Td) vaccine.** / 1 dose of Td every 10 years.  Varicella vaccine.** / Consult your caregiver.  Zoster vaccine.** / 1 dose for adults aged 71 years or older.  Pneumococcal 13-valent conjugate (PCV13) vaccine.** / Consult your caregiver.  Pneumococcal polysaccharide (PPSV23) vaccine.** / 1 dose for all adults aged 39 years and older.  Meningococcal vaccine.** / Consult your caregiver.  Hepatitis A vaccine.** / Consult your caregiver.  Hepatitis B vaccine.** / Consult your caregiver.  Haemophilus influenzae type b (Hib) vaccine.** / Consult your caregiver. ** Family history and personal history of risk and conditions may change your caregiver's recommendations. Document Released: 03/11/2001 Document Revised: 05/10/2012 Document Reviewed: 06/10/2010 [facility] Patient Information 2014 [place], [place]. Asthma, Adult Asthma is a recurring condition in which the airways tighten and narrow. Asthma can make it difficult to breathe. It can cause coughing, wheezing, and shortness of breath. Asthma episodes (also called asthma attacks) range from minor to life-threatening. Asthma cannot be cured, but medicines and lifestyle changes can help control it. CAUSES Asthma is believed to be caused by inherited (genetic) and environmental factors, but its exact cause is unknown. Asthma may be triggered by allergens, lung infections, or irritants in the air.  Asthma triggers are different for each person. Common triggers include:   Animal dander.  Dust mites.  Cockroaches.  Pollen from trees or grass.  Mold.  Smoke.  Air pollutants such as dust, household cleaners, hair sprays, aerosol sprays, paint fumes, strong chemicals, or strong odors.  Cold air, weather changes, and winds (which increase molds and pollens in the air).  Strong emotional expressions such as crying or laughing hard.  Stress.  Certain medicines (such as aspirin) or types of drugs (such as beta-blockers).  Sulfites in foods and drinks. Foods and drinks that may contain sulfites include dried fruit, potato chips, and sparkling grape juice.  Infections or inflammatory conditions such as the flu, a cold, or an inflammation of the nasal membranes (rhinitis).  Gastroesophageal reflux disease (GERD).  Exercise or strenuous activity. SYMPTOMS Symptoms may occur immediately after asthma is triggered or many hours later. Symptoms include:  Wheezing.  Excessive nighttime or early morning coughing.  Frequent or severe coughing with a common cold.  Chest tightness.  Shortness  of breath. DIAGNOSIS  The diagnosis of asthma is made by a review of your medical history and a physical exam. Tests may also be performed. These may include:  Lung function studies. These tests show how much air you breath in and out.  Allergy tests.  Imaging tests such as X-rays. TREATMENT  Asthma cannot be cured, but it can usually be controlled. Treatment involves identifying and avoiding your asthma triggers. It also involves medicines. There are 2 classes of medicine used for asthma treatment:   Controller medicines. These prevent asthma symptoms from occurring. They are usually taken every day.  Reliever or rescue medicines. These quickly relieve asthma symptoms. They are used as needed and provide short-term relief. Your health care provider will help you create an asthma action  plan. An asthma action plan is a written plan for managing and treating your asthma attacks. It includes a list of your asthma triggers and how they may be avoided. It also includes information on when medicines should be taken and when their dosage should be changed. An action plan may also involve the use of a device called a peak flow meter. A peak flow meter measures how well the lungs are working. It helps you monitor your condition. HOME CARE INSTRUCTIONS   Take medicine as directed by your health care provider. Speak with your health care provider if you have questions about how or when to take the medicines.  Use a peak flow meter as directed by your health care provider. Record and keep track of readings.  Understand and use the action plan to help minimize or stop an asthma attack without needing to seek medical care.  Control your home environment in the following ways to help prevent asthma attacks:  Do not smoke. Avoid being exposed to secondhand smoke.  Change your heating and air conditioning filter regularly.  Limit your use of fireplaces and wood stoves.  Get rid of pests (such as roaches and mice) and their droppings.  Throw away plants if you see mold on them.  Clean your floors and dust regularly. Use unscented cleaning products.  Try to have someone else vacuum for you regularly. Stay out of rooms while they are being vacuumed and for a short while afterward. If you vacuum, use a dust mask from a hardware store, a double-layered or microfilter vacuum cleaner bag, or a vacuum cleaner with a HEPA filter.  Replace carpet with wood, tile, or vinyl flooring. Carpet can trap dander and dust.  Use allergy-proof pillows, mattress covers, and box spring covers.  Wash bed sheets and blankets every week in hot water and dry them in a dryer.  Use blankets that are made of polyester or cotton.  Clean bathrooms and kitchens with bleach. If possible, have someone repaint the  walls in these rooms with mold-resistant paint. Keep out of the rooms that are being cleaned and painted.  Wash hands frequently. SEEK MEDICAL CARE IF:   You have wheezing, shortness of breath, or a cough even if taking medicine to prevent attacks.  The colored mucus you cough up (sputum) is thicker than usual.  Your sputum changes from clear or white to yellow, green, gray, or bloody.  You have any problems that may be related to the medicines you are taking (such as a rash, itching, swelling, or trouble breathing).  You are using a reliever medicine more than 2 3 times per week.  Your peak flow is still at 50 79% of you personal best  after following your action plan for 1 hour. SEEK IMMEDIATE MEDICAL CARE IF:   You seem to be getting worse and are unresponsive to treatment during an asthma attack.  You are short of breath even at rest.  You get short of breath when doing very little physical activity.  You have difficulty eating, drinking, or talking due to asthma symptoms.  You develop chest pain.  You develop a fast heartbeat.  You have a bluish color to your lips or fingernails.  You are lightheaded, dizzy, or faint.  Your peak flow is less than 50% of your personal best.  You have a fever or persistent symptoms for more than 2 3 days.  You have a fever and symptoms suddenly get worse. MAKE SURE YOU:   Understand these instructions.  Will watch your condition.  Will get help right away if you are not doing well or get worse. Document Released: 01/13/2005 Document Revised: 09/15/2012 Document Reviewed: 08/12/2012 [facility] Patient Information 2014 [place], [place].

## 2013-01-14 NOTE — Assessment & Plan Note (Signed)
She will try belviq and will see a nutritionist

## 2013-01-14 NOTE — Progress Notes (Signed)
Pre visit review using our clinic review tool, if applicable. No additional management support is needed unless otherwise documented below in the visit note. 

## 2013-01-14 NOTE — Assessment & Plan Note (Signed)
Exam done Vaccines reviewed She was referred for a colonoscopy Labs ordered Pt ed material was given

## 2013-01-16 ENCOUNTER — Encounter: Payer: Self-pay | Admitting: Internal Medicine

## 2013-02-03 ENCOUNTER — Telehealth: Payer: Self-pay | Admitting: *Deleted

## 2013-02-03 NOTE — Telephone Encounter (Signed)
Spouse called stating that the pharmacy informed them that the insurance company has requested a prior authorization for her belviq. Pharmacy has informed patient & spouse that the doctor's office has to initiate prior auth.  Please advise.  CB# [phone]

## 2013-02-15 NOTE — Telephone Encounter (Signed)
PA approved 01/16/13-05/16/13 #[id number]. Pharmacy and patient notified.

## 2013-02-21 ENCOUNTER — Other Ambulatory Visit: Payer: Self-pay | Admitting: Gastroenterology

## 2013-02-21 DIAGNOSIS — R1013 Epigastric pain: Secondary | ICD-10-CM

## 2013-03-11 ENCOUNTER — Other Ambulatory Visit (HOSPITAL_COMMUNITY): Payer: BC Managed Care – PPO

## 2013-03-11 ENCOUNTER — Ambulatory Visit (HOSPITAL_COMMUNITY): Payer: BC Managed Care – PPO

## 2013-03-18 ENCOUNTER — Ambulatory Visit (HOSPITAL_COMMUNITY)
Admission: RE | Admit: 2013-03-18 | Discharge: 2013-03-18 | Disposition: A | Discharge status: Home / Self Care | Payer: BC Managed Care – PPO | Source: Ambulatory Visit | Attending: Gastroenterology | Admitting: Gastroenterology

## 2013-03-18 ENCOUNTER — Encounter (HOSPITAL_COMMUNITY)
Admission: RE | Admit: 2013-03-18 | Discharge: 2013-03-18 | Disposition: A | Discharge status: Home / Self Care | Payer: BC Managed Care – PPO | Source: Ambulatory Visit | Attending: Gastroenterology | Admitting: Gastroenterology

## 2013-03-18 DIAGNOSIS — R112 Nausea with vomiting, unspecified: Secondary | ICD-10-CM | POA: Insufficient documentation

## 2013-03-18 DIAGNOSIS — R1013 Epigastric pain: Secondary | ICD-10-CM | POA: Insufficient documentation

## 2013-03-18 DIAGNOSIS — N281 Cyst of kidney, acquired: Secondary | ICD-10-CM | POA: Insufficient documentation

## 2013-03-18 MED ORDER — SINCALIDE 5 MCG IJ SOLR
0.0200 ug/kg | Freq: Once | INTRAMUSCULAR | Status: AC
  Administered 2013-03-18: 1.5 ug via INTRAVENOUS

## 2013-03-18 MED ORDER — TECHNETIUM TC 99M MEBROFENIN IV KIT
5.0000 | PACK | Freq: Once | INTRAVENOUS | Status: AC | PRN
Start: 2013-03-18 — End: 2013-03-18
  Administered 2013-03-18: 5 via INTRAVENOUS

## 2013-04-06 ENCOUNTER — Encounter: Payer: Self-pay | Admitting: Internal Medicine

## 2013-05-13 ENCOUNTER — Encounter: Payer: Self-pay | Admitting: Internal Medicine

## 2013-05-13 ENCOUNTER — Ambulatory Visit (INDEPENDENT_AMBULATORY_CARE_PROVIDER_SITE_OTHER): Payer: BC Managed Care – PPO | Admitting: Internal Medicine

## 2013-05-13 ENCOUNTER — Ambulatory Visit (INDEPENDENT_AMBULATORY_CARE_PROVIDER_SITE_OTHER)
Admission: RE | Admit: 2013-05-13 | Discharge: 2013-05-13 | Disposition: A | Discharge status: Home / Self Care | Payer: BC Managed Care – PPO | Source: Ambulatory Visit | Attending: Internal Medicine | Admitting: Internal Medicine

## 2013-05-13 VITALS — BP 128/88 | HR 66 | Temp 97.2°F | Resp 16 | Wt 162.0 lb

## 2013-05-13 DIAGNOSIS — M79609 Pain in unspecified limb: Secondary | ICD-10-CM

## 2013-05-13 DIAGNOSIS — M79641 Pain in right hand: Secondary | ICD-10-CM

## 2013-05-13 DIAGNOSIS — M5412 Radiculopathy, cervical region: Secondary | ICD-10-CM

## 2013-05-13 MED ORDER — DICLOFENAC 18 MG PO CAPS: 1.0000 | ORAL_CAPSULE | Freq: Three times a day (TID) | ORAL | Status: DC | PRN

## 2013-05-13 NOTE — Patient Instructions (Signed)
Osteoarthritis Osteoarthritis is a disease that causes soreness and swelling (inflammation) of a joint. It occurs when the cartilage at the affected joint wears down. Cartilage acts as a cushion, covering the ends of bones where they meet to form a joint. Osteoarthritis is the most common form of arthritis. It often occurs in older people. The joints affected most often by this condition include those in the:  Ends of the fingers.  Thumbs.  Neck.  Lower back.  Knees.  Hips. CAUSES  Over time, the cartilage that covers the ends of bones begins to wear away. This causes bone to rub on bone, producing pain and stiffness in the affected joints.  RISK FACTORS Certain factors can increase your chances of having osteoarthritis, including:  Older age.  Excessive body weight.  Overuse of joints. SIGNS AND SYMPTOMS   Pain, swelling, and stiffness in the joint.  Over time, the joint may lose its normal shape.  Small deposits of bone (osteophytes) may grow on the edges of the joint.  Bits of bone or cartilage can break off and float inside the joint space. This may cause more pain and damage. DIAGNOSIS  Your health care provider will do a physical exam and ask about your symptoms. Various tests may be ordered, such as:  X-rays of the affected joint.  An MRI scan.  Blood tests to rule out other types of arthritis.  Joint fluid tests. This involves using a needle to draw fluid from the joint and examining the fluid under a microscope. TREATMENT  Goals of treatment are to control pain and improve joint function. Treatment plans may include:  A prescribed exercise program that allows for rest and joint relief.  A weight control plan.  Pain relief techniques, such as:  Properly applied heat and cold.  Electric pulses delivered to nerve endings under the skin (transcutaneous electrical nerve stimulation, TENS).  Massage.  Certain nutritional supplements.  Medicines to  control pain, such as:  Acetaminophen.  Nonsteroidal anti-inflammatory drugs (NSAIDs), such as naproxen.  Narcotic or central-acting agents, such as tramadol.  Corticosteroids. These can be given orally or as an injection.  Surgery to reposition the bones and relieve pain (osteotomy) or to remove loose pieces of bone and cartilage. Joint replacement may be needed in advanced states of osteoarthritis. HOME CARE INSTRUCTIONS   Only take over-the-counter or prescription medicines as directed by your health care provider. Take all medicines exactly as instructed.  Maintain a healthy weight. Follow your health care provider's instructions for weight control. This may include dietary instructions.  Exercise as directed. Your health care provider can recommend specific types of exercise. These may include:  Strengthening exercises These are done to strengthen the muscles that support joints affected by arthritis. They can be performed with weights or with exercise bands to add resistance.  Aerobic activities These are exercises, such as brisk walking or low-impact aerobics, that get your heart pumping.  Range-of-motion activities These keep your joints limber.  Balance and agility exercises These help you maintain daily living skills.  Rest your affected joints as directed by your health care provider.  Follow up with your health care provider as directed. SEEK MEDICAL CARE IF:   Your skin turns red.  You develop a rash in addition to your joint pain.  You have worsening joint pain. SEEK IMMEDIATE MEDICAL CARE IF:  You have a significant loss of weight or appetite.  You have a fever along with joint or muscle aches.  You have   night sweats. FOR MORE INFORMATION  National Institute of Arthritis and Musculoskeletal and Skin Diseases: www.niams.nih.gov National Institute on Aging: www.nia.nih.gov American College of Rheumatology: www.rheumatology.org Document Released: 01/13/2005  Document Revised: 11/03/2012 Document Reviewed: 09/20/2012 [employer] Patient Information 2014 [employer].  

## 2013-05-13 NOTE — Progress Notes (Signed)
Pre visit review using our clinic review tool, if applicable. No additional management support is needed unless otherwise documented below in the visit note. 

## 2013-05-13 NOTE — Progress Notes (Signed)
Subjective:    Patient ID: Monica Barnes, female    DOB: 1955/09/29, 58 y.o.   MRN: [id number]  Neck Pain  This is a recurrent problem. The current episode started more than 1 year ago. The problem occurs intermittently. The problem has been gradually worsening. The pain is associated with a fall. The pain is present in the right side. The quality of the pain is described as stabbing and shooting. The pain is at a severity of 2/10. The pain is mild. Nothing aggravates the symptoms. The pain is worse during the day. Associated symptoms include tingling (right arm). Pertinent negatives include no chest pain, fever, headaches, leg pain, numbness, pain with swallowing, paresis, photophobia, syncope, trouble swallowing, visual change, weakness or weight loss. She has tried nothing for the symptoms. The treatment provided no relief.      Review of Systems  Constitutional: Negative.  Negative for fever, chills, weight loss, diaphoresis, appetite change and fatigue.  HENT: Negative.  Negative for trouble swallowing.   Eyes: Negative.  Negative for photophobia.  Respiratory: Negative.  Negative for cough, chest tightness, shortness of breath and stridor.   Cardiovascular: Negative.  Negative for chest pain, palpitations, leg swelling and syncope.  Gastrointestinal: Negative.  Negative for abdominal pain.  Endocrine: Negative.   Genitourinary: Negative.   Musculoskeletal: Positive for arthralgias (right hand pain in the 1st MTP joint), neck pain and neck stiffness. Negative for back pain, gait problem, joint swelling and myalgias.  Skin: Negative.   Allergic/Immunologic: Negative.   Neurological: Positive for tingling (right arm). Negative for weakness, numbness and headaches.  Hematological: Negative.  Negative for adenopathy. Does not bruise/bleed easily.  Psychiatric/Behavioral: Negative.        Objective:   Physical Exam  Vitals reviewed. Constitutional: She is oriented to person, place,  and time. She appears well-developed and well-nourished. No distress.  HENT:  Head: Normocephalic and atraumatic.  Mouth/Throat: Oropharynx is clear and moist. No oropharyngeal exudate.  Eyes: Conjunctivae are normal. Right eye exhibits no discharge. Left eye exhibits no discharge. No scleral icterus.  Neck: Normal range of motion. Neck supple. No JVD present. No tracheal deviation present. No thyromegaly present.  Cardiovascular: Normal rate, regular rhythm, normal heart sounds and intact distal pulses.  Exam reveals no gallop and no friction rub.   No murmur heard. Pulmonary/Chest: Effort normal and breath sounds normal. No stridor. No respiratory distress. She has no wheezes. She has no rales. She exhibits no tenderness.  Abdominal: Soft. Bowel sounds are normal. She exhibits no distension and no mass. There is no tenderness. There is no rebound and no guarding.  Musculoskeletal: Normal range of motion. She exhibits no edema and no tenderness.       Cervical back: Normal. She exhibits normal range of motion, no tenderness, no bony tenderness, no swelling, no edema, no deformity, no laceration, no pain, no spasm and normal pulse.       Right hand: She exhibits bony tenderness. She exhibits normal range of motion, no tenderness, normal two-point discrimination, normal capillary refill, no deformity, no laceration and no swelling. Normal sensation noted.       Hands: Lymphadenopathy:    She has no cervical adenopathy.  Neurological: She is alert and oriented to person, place, and time. She has normal strength. She displays abnormal reflex. She displays no atrophy and no tremor. No cranial nerve deficit or sensory deficit. She exhibits normal muscle tone. She displays a negative Romberg sign. She displays no seizure activity. Coordination and  gait normal. She displays no Babinski's sign on the right side. She displays no Babinski's sign on the left side.  Reflex Scores:      Tricep reflexes are 1+  on the right side and 2+ on the left side.      Bicep reflexes are 1+ on the right side and 2+ on the left side.      Brachioradialis reflexes are 1+ on the right side and 2+ on the left side.      Patellar reflexes are 2+ on the right side and 2+ on the left side.      Achilles reflexes are 2+ on the right side and 2+ on the left side. Skin: Skin is warm and dry. No rash noted. She is not diaphoretic. No erythema. No pallor.  Psychiatric: She has a normal mood and affect. Her behavior is normal. Judgment and thought content normal.     Lab Results  Component Value Date   WBC 7.1 01/14/2013   HGB 12.9 01/14/2013   HCT 38.0 01/14/2013   PLT 219.0 01/14/2013   GLUCOSE 107* 01/14/2013   CHOL 214* 01/14/2013   TRIG 261.0* 01/14/2013   HDL 61.60 01/14/2013   LDLDIRECT 137.9 01/14/2013   LDLCALC  Value: 120        Total Cholesterol/HDL:CHD Risk Coronary Heart Disease Risk Table                     Men   Women  1/2 Average Risk   3.4   3.3* 11/26/2007   ALT 46* 01/14/2013   AST 36 01/14/2013   NA 140 01/14/2013   K 3.9 01/14/2013   CL 107 01/14/2013   CREATININE 0.7 01/14/2013   BUN 17 01/14/2013   CO2 24 01/14/2013   TSH 1.36 01/14/2013   HGBA1C  Value: 5.6 (NOTE)   The ADA recommends the following therapeutic goal for glycemic   control related to Hgb A1C measurement:   Goal of Therapy:   < 7.0% Hgb A1C   Reference: American Diabetes Association: Clinical Practice   Recommendations 2008, Diabetes Care,  2008, 31:(Suppl 1). 11/26/2007       Assessment & Plan:

## 2013-05-14 ENCOUNTER — Encounter: Payer: Self-pay | Admitting: Internal Medicine

## 2013-05-14 NOTE — Assessment & Plan Note (Signed)
She appears to have spinal stenosis Will try pain relief with nsaids She wants to see Dr. Joya Salm to see if surgery will help this

## 2013-05-14 NOTE — Assessment & Plan Note (Signed)
This appears to be OA She will try tylenol and nsaids for the pain

## 2013-06-24 ENCOUNTER — Other Ambulatory Visit: Payer: Self-pay | Admitting: Internal Medicine

## 2013-06-27 ENCOUNTER — Telehealth: Payer: Self-pay

## 2013-06-27 NOTE — Telephone Encounter (Signed)
Received pharmacy rejection stating that insurance will not cover Belviq  without a prior authorization. PA started via cover my meds and faxed to insurance company.

## 2013-08-05 ENCOUNTER — Other Ambulatory Visit: Payer: Self-pay | Admitting: Internal Medicine

## 2013-10-05 ENCOUNTER — Telehealth: Payer: Self-pay | Admitting: Internal Medicine

## 2013-10-05 NOTE — Telephone Encounter (Signed)
Patient recently changed insurance companies to [employer].  Member number [id number].  Group number [id number].  This insurance company is requesting a Technical sales engineer for [employer].  Number to reach [place].  Patient has not received card yet.

## 2013-10-10 NOTE — Telephone Encounter (Signed)
Forwarding msg to assist to get PA done...Johny Chess

## 2013-10-10 NOTE — Telephone Encounter (Signed)
Patient's husband called again regarding PA for Belviq. New insurance co needs to know that this is medically necessary bc she is obese. CB# [phone]  [facility] pharmacy ph # [phone]

## 2013-10-12 NOTE — Telephone Encounter (Signed)
Pt's spouse called request for our office to do this PA with live person at [place]. Please call [phone] for [employer] and push 2 for a live person. Pt is out of this medication and request for this to be done ASAP if possible.

## 2013-10-12 NOTE — Telephone Encounter (Signed)
[employer], clinical information provided. Per rep, information will be submitted for review and may take 24-48 hrs to receive a discission.

## 2013-10-13 NOTE — Telephone Encounter (Signed)
Received denial  From [employer] stating Belviq has been denied due to being contractually excluded from [facility] plan benefit. Per insurance will notify pt of denial.

## 2013-10-17 ENCOUNTER — Telehealth: Payer: Self-pay | Admitting: Internal Medicine

## 2013-10-17 NOTE — Telephone Encounter (Signed)
Insurance needs call from providers office in order to continue Belviq for obesity.  Please call [phone]

## 2013-10-18 NOTE — Telephone Encounter (Signed)
[employer] per message, denial letter already received from [facility] stating Belviq has been denied due to being contractually excluded from [facility] plan benefit. Per representative, member can try to appeal this discission.

## 2014-01-11 LAB — HM PAP SMEAR: HM Pap smear: NORMAL

## 2014-01-11 LAB — HM MAMMOGRAPHY: HM MAMMO: NORMAL

## 2014-03-16 ENCOUNTER — Other Ambulatory Visit (INDEPENDENT_AMBULATORY_CARE_PROVIDER_SITE_OTHER): Payer: Managed Care, Other (non HMO)

## 2014-03-16 ENCOUNTER — Encounter: Payer: Self-pay | Admitting: Internal Medicine

## 2014-03-16 ENCOUNTER — Ambulatory Visit (INDEPENDENT_AMBULATORY_CARE_PROVIDER_SITE_OTHER): Payer: Managed Care, Other (non HMO) | Admitting: Internal Medicine

## 2014-03-16 VITALS — BP 130/82 | HR 75 | Temp 97.8°F | Resp 16 | Ht 62.0 in | Wt 172.0 lb

## 2014-03-16 DIAGNOSIS — R7989 Other specified abnormal findings of blood chemistry: Secondary | ICD-10-CM

## 2014-03-16 DIAGNOSIS — Z0000 Encounter for general adult medical examination without abnormal findings: Secondary | ICD-10-CM

## 2014-03-16 LAB — CBC WITH DIFFERENTIAL/PLATELET
BASOS PCT: 0.3 % (ref 0.0–3.0)
Basophils Absolute: 0 10*3/uL (ref 0.0–0.1)
EOS ABS: 0.2 10*3/uL (ref 0.0–0.7)
Eosinophils Relative: 2.5 % (ref 0.0–5.0)
HCT: 38.3 % (ref 36.0–46.0)
HEMOGLOBIN: 13.3 g/dL (ref 12.0–15.0)
Lymphocytes Relative: 20 % (ref 12.0–46.0)
Lymphs Abs: 1.4 10*3/uL (ref 0.7–4.0)
MCHC: 34.6 g/dL (ref 30.0–36.0)
MCV: 90.8 fl (ref 78.0–100.0)
Monocytes Absolute: 0.6 10*3/uL (ref 0.1–1.0)
Monocytes Relative: 8 % (ref 3.0–12.0)
NEUTROS ABS: 4.8 10*3/uL (ref 1.4–7.7)
Neutrophils Relative %: 69.2 % (ref 43.0–77.0)
Platelets: 215 10*3/uL (ref 150.0–400.0)
RBC: 4.22 Mil/uL (ref 3.87–5.11)
RDW: 13 % (ref 11.5–15.5)
WBC: 7 10*3/uL (ref 4.0–10.5)

## 2014-03-16 LAB — LIPID PANEL
CHOLESTEROL: 251 mg/dL — AB (ref 0–200)
HDL: 76 mg/dL (ref >39.00)
NONHDL: 175
Total CHOL/HDL Ratio: 3
Triglycerides: 260 mg/dL — ABNORMAL HIGH (ref 0.0–149.0)
VLDL: 52 mg/dL — ABNORMAL HIGH (ref 0.0–40.0)

## 2014-03-16 LAB — COMPREHENSIVE METABOLIC PANEL
ALT: 34 U/L (ref 0–35)
AST: 31 U/L (ref 0–37)
Albumin: 4.6 g/dL (ref 3.5–5.2)
Alkaline Phosphatase: 96 U/L (ref 39–117)
BUN: 17 mg/dL (ref 6–23)
CALCIUM: 9.8 mg/dL (ref 8.4–10.5)
CHLORIDE: 102 meq/L (ref 96–112)
CO2: 29 meq/L (ref 19–32)
Creatinine, Ser: 0.82 mg/dL (ref 0.40–1.20)
GFR: 75.55 mL/min (ref >60.00)
Glucose, Bld: 94 mg/dL (ref 70–99)
POTASSIUM: 4.8 meq/L (ref 3.5–5.1)
SODIUM: 137 meq/L (ref 135–145)
TOTAL PROTEIN: 7.4 g/dL (ref 6.0–8.3)
Total Bilirubin: 0.4 mg/dL (ref 0.2–1.2)

## 2014-03-16 LAB — LDL CHOLESTEROL, DIRECT: Direct LDL: 143 mg/dL

## 2014-03-16 LAB — TSH: TSH: 1.09 u[IU]/mL (ref 0.35–4.50)

## 2014-03-16 NOTE — Progress Notes (Signed)
Pre visit review using our clinic review tool, if applicable. No additional management support is needed unless otherwise documented below in the visit note. 

## 2014-03-16 NOTE — Progress Notes (Signed)
   Subjective:    Patient ID: Monica Barnes, female    DOB: 1955-07-22, 59 y.o.   MRN: [id number]  HPI  She returns for a physical - she feels well and offers no complaints.  Review of Systems  Constitutional: Negative.  Negative for fever, chills, diaphoresis, appetite change and fatigue.  HENT: Negative.   Eyes: Negative.   Respiratory: Negative.  Negative for cough, choking, chest tightness, shortness of breath and stridor.   Cardiovascular: Negative.  Negative for chest pain, palpitations and leg swelling.  Gastrointestinal: Negative.  Negative for abdominal pain.  Endocrine: Negative.   Genitourinary: Negative.   Musculoskeletal: Negative.   Skin: Negative.   Allergic/Immunologic: Negative.   Neurological: Negative.   Hematological: Negative.  Negative for adenopathy. Does not bruise/bleed easily.  Psychiatric/Behavioral: Negative.        Objective:   Physical Exam  Constitutional: She is oriented to person, place, and time. She appears well-developed and well-nourished. No distress.  HENT:  Head: Normocephalic and atraumatic.  Mouth/Throat: Oropharynx is clear and moist. No oropharyngeal exudate.  Eyes: Conjunctivae are normal. Right eye exhibits no discharge. Left eye exhibits no discharge. No scleral icterus.  Neck: Normal range of motion. Neck supple. No JVD present. No tracheal deviation present. No thyromegaly present.  Cardiovascular: Normal rate, regular rhythm, normal heart sounds and intact distal pulses.  Exam reveals no gallop and no friction rub.   No murmur heard. Pulmonary/Chest: Effort normal and breath sounds normal. No stridor. No respiratory distress. She has no wheezes. She has no rales. She exhibits no tenderness.  Abdominal: Soft. Bowel sounds are normal. She exhibits no distension and no mass. There is no tenderness. There is no rebound and no guarding.  Musculoskeletal: Normal range of motion. She exhibits no edema or tenderness.  Lymphadenopathy:   She has no cervical adenopathy.  Neurological: She is oriented to person, place, and time.  Skin: Skin is warm and dry. No rash noted. She is not diaphoretic. No erythema. No pallor.  Nursing note and vitals reviewed.    Lab Results  Component Value Date   WBC 7.1 01/14/2013   HGB 12.9 01/14/2013   HCT 38.0 01/14/2013   PLT 219.0 01/14/2013   GLUCOSE 107* 01/14/2013   CHOL 214* 01/14/2013   TRIG 261.0* 01/14/2013   HDL 61.60 01/14/2013   LDLDIRECT 137.9 01/14/2013   LDLCALC * 11/26/2007    120        Total Cholesterol/HDL:CHD Risk Coronary Heart Disease Risk Table                     Men   Women  1/2 Average Risk   3.4   3.3   ALT 46* 01/14/2013   AST 36 01/14/2013   NA 140 01/14/2013   K 3.9 01/14/2013   CL 107 01/14/2013   CREATININE 0.7 01/14/2013   BUN 17 01/14/2013   CO2 24 01/14/2013   TSH 1.36 01/14/2013   HGBA1C  11/26/2007    5.6 (NOTE)   The ADA recommends the following therapeutic goal for glycemic   control related to Hgb A1C measurement:   Goal of Therapy:   < 7.0% Hgb A1C   Reference: American Diabetes Association: Clinical Practice   Recommendations 2008, Diabetes Care,  2008, 31:(Suppl 1).       Assessment & Plan:

## 2014-03-16 NOTE — Patient Instructions (Signed)
Preventive Care for Adults A healthy lifestyle and preventive care can promote health and wellness. Preventive health guidelines for women include the following key practices.  A routine yearly physical is a good way to check with your health care provider about your health and preventive screening. It is a chance to share any concerns and updates on your health and to receive a thorough exam.  Visit your dentist for a routine exam and preventive care every 6 months. Brush your teeth twice a day and floss once a day. Good oral hygiene prevents tooth decay and gum disease.  The frequency of eye exams is based on your age, health, family medical history, use of contact lenses, and other factors. Follow your health care provider's recommendations for frequency of eye exams.  Eat a healthy diet. Foods like vegetables, fruits, whole grains, low-fat dairy products, and lean protein foods contain the nutrients you need without too many calories. Decrease your intake of foods high in solid fats, added sugars, and salt. Eat the right amount of calories for you.Get information about a proper diet from your health care provider, if necessary.  Regular physical exercise is one of the most important things you can do for your health. Most adults should get at least 150 minutes of moderate-intensity exercise (any activity that increases your heart rate and causes you to sweat) each week. In addition, most adults need muscle-strengthening exercises on 2 or more days a week.  Maintain a healthy weight. The body mass index (BMI) is a screening tool to identify possible weight problems. It provides an estimate of body fat based on height and weight. Your health care provider can find your BMI and can help you achieve or maintain a healthy weight.For adults 20 years and older:  A BMI below 18.5 is considered underweight.  A BMI of 18.5 to 24.9 is normal.  A BMI of 25 to 29.9 is considered overweight.  A BMI of  30 and above is considered obese.  Maintain normal blood lipids and cholesterol levels by exercising and minimizing your intake of saturated fat. Eat a balanced diet with plenty of fruit and vegetables. Blood tests for lipids and cholesterol should begin at age 76 and be repeated every 5 years. If your lipid or cholesterol levels are high, you are over 50, or you are at high risk for heart disease, you may need your cholesterol levels checked more frequently.Ongoing high lipid and cholesterol levels should be treated with medicines if diet and exercise are not working.  If you smoke, find out from your health care provider how to quit. If you do not use tobacco, do not start.  Lung cancer screening is recommended for adults aged 22-80 years who are at high risk for developing lung cancer because of a history of smoking. A yearly low-dose CT scan of the lungs is recommended for people who have at least a 30-pack-year history of smoking and are a current smoker or have quit within the past 15 years. A pack year of smoking is smoking an average of 1 pack of cigarettes a day for 1 year (for example: 1 pack a day for 30 years or 2 packs a day for 15 years). Yearly screening should continue until the smoker has stopped smoking for at least 15 years. Yearly screening should be stopped for people who develop a health problem that would prevent them from having lung cancer treatment.  If you are pregnant, do not drink alcohol. If you are breastfeeding,  be very cautious about drinking alcohol. If you are not pregnant and choose to drink alcohol, do not have more than 1 drink per day. One drink is considered to be 12 ounces (355 mL) of beer, 5 ounces (148 mL) of wine, or 1.5 ounces (44 mL) of liquor.  Avoid use of street drugs. Do not share needles with anyone. Ask for help if you need support or instructions about stopping the use of drugs.  High blood pressure causes heart disease and increases the risk of  stroke. Your blood pressure should be checked at least every 1 to 2 years. Ongoing high blood pressure should be treated with medicines if weight loss and exercise do not work.  If you are 75-52 years old, ask your health care provider if you should take aspirin to prevent strokes.  Diabetes screening involves taking a blood sample to check your fasting blood sugar level. This should be done once every 3 years, after age 15, if you are within normal weight and without risk factors for diabetes. Testing should be considered at a younger age or be carried out more frequently if you are overweight and have at least 1 risk factor for diabetes.  Breast cancer screening is essential preventive care for women. You should practice "breast self-awareness." This means understanding the normal appearance and feel of your breasts and may include breast self-examination. Any changes detected, no matter how small, should be reported to a health care provider. Women in their 58s and 30s should have a clinical breast exam (CBE) by a health care provider as part of a regular health exam every 1 to 3 years. After age 16, women should have a CBE every year. Starting at age 53, women should consider having a mammogram (breast X-ray test) every year. Women who have a family history of breast cancer should talk to their health care provider about genetic screening. Women at a high risk of breast cancer should talk to their health care providers about having an MRI and a mammogram every year.  Breast cancer gene (BRCA)-related cancer risk assessment is recommended for women who have family members with BRCA-related cancers. BRCA-related cancers include breast, ovarian, tubal, and peritoneal cancers. Having family members with these cancers may be associated with an increased risk for harmful changes (mutations) in the breast cancer genes BRCA1 and BRCA2. Results of the assessment will determine the need for genetic counseling and  BRCA1 and BRCA2 testing.  Routine pelvic exams to screen for cancer are no longer recommended for nonpregnant women who are considered low risk for cancer of the pelvic organs (ovaries, uterus, and vagina) and who do not have symptoms. Ask your health care provider if a screening pelvic exam is right for you.  If you have had past treatment for cervical cancer or a condition that could lead to cancer, you need Pap tests and screening for cancer for at least 20 years after your treatment. If Pap tests have been discontinued, your risk factors (such as having a new sexual partner) need to be reassessed to determine if screening should be resumed. Some women have medical problems that increase the chance of getting cervical cancer. In these cases, your health care provider may recommend more frequent screening and Pap tests.  The HPV test is an additional test that may be used for cervical cancer screening. The HPV test looks for the virus that can cause the cell changes on the cervix. The cells collected during the Pap test can be  tested for HPV. The HPV test could be used to screen women aged 30 years and older, and should be used in women of any age who have unclear Pap test results. After the age of 30, women should have HPV testing at the same frequency as a Pap test.  Colorectal cancer can be detected and often prevented. Most routine colorectal cancer screening begins at the age of 50 years and continues through age 75 years. However, your health care provider may recommend screening at an earlier age if you have risk factors for colon cancer. On a yearly basis, your health care provider may provide home test kits to check for hidden blood in the stool. Use of a small camera at the end of a tube, to directly examine the colon (sigmoidoscopy or colonoscopy), can detect the earliest forms of colorectal cancer. Talk to your health care provider about this at age 50, when routine screening begins. Direct  exam of the colon should be repeated every 5-10 years through age 75 years, unless early forms of pre-cancerous polyps or small growths are found.  People who are at an increased risk for hepatitis B should be screened for this virus. You are considered at high risk for hepatitis B if:  You were born in a country where hepatitis B occurs often. Talk with your health care provider about which countries are considered high risk.  Your parents were born in a high-risk country and you have not received a shot to protect against hepatitis B (hepatitis B vaccine).  You have HIV or AIDS.  You use needles to inject street drugs.  You live with, or have sex with, someone who has hepatitis B.  You get hemodialysis treatment.  You take certain medicines for conditions like cancer, organ transplantation, and autoimmune conditions.  Hepatitis C blood testing is recommended for all people born from 1945 through 1965 and any individual with known risks for hepatitis C.  Practice safe sex. Use condoms and avoid high-risk sexual practices to reduce the spread of sexually transmitted infections (STIs). STIs include gonorrhea, chlamydia, syphilis, trichomonas, herpes, HPV, and human immunodeficiency virus (HIV). Herpes, HIV, and HPV are viral illnesses that have no cure. They can result in disability, cancer, and death.  You should be screened for sexually transmitted illnesses (STIs) including gonorrhea and chlamydia if:  You are sexually active and are younger than 24 years.  You are older than 24 years and your health care provider tells you that you are at risk for this type of infection.  Your sexual activity has changed since you were last screened and you are at an increased risk for chlamydia or gonorrhea. Ask your health care provider if you are at risk.  If you are at risk of being infected with HIV, it is recommended that you take a prescription medicine daily to prevent HIV infection. This is  called preexposure prophylaxis (PrEP). You are considered at risk if:  You are a heterosexual woman, are sexually active, and are at increased risk for HIV infection.  You take drugs by injection.  You are sexually active with a partner who has HIV.  Talk with your health care provider about whether you are at high risk of being infected with HIV. If you choose to begin PrEP, you should first be tested for HIV. You should then be tested every 3 months for as long as you are taking PrEP.  Osteoporosis is a disease in which the bones lose minerals and strength   with aging. This can result in serious bone fractures or breaks. The risk of osteoporosis can be identified using a bone density scan. Women ages 65 years and over and women at risk for fractures or osteoporosis should discuss screening with their health care providers. Ask your health care provider whether you should take a calcium supplement or vitamin D to reduce the rate of osteoporosis.  Menopause can be associated with physical symptoms and risks. Hormone replacement therapy is available to decrease symptoms and risks. You should talk to your health care provider about whether hormone replacement therapy is right for you.  Use sunscreen. Apply sunscreen liberally and repeatedly throughout the day. You should seek shade when your shadow is shorter than you. Protect yourself by wearing long sleeves, pants, a wide-brimmed hat, and sunglasses year round, whenever you are outdoors.  Once a month, do a whole body skin exam, using a mirror to look at the skin on your back. Tell your health care provider of new moles, moles that have irregular borders, moles that are larger than a pencil eraser, or moles that have changed in shape or color.  Stay current with required vaccines (immunizations).  Influenza vaccine. All adults should be immunized every year.  Tetanus, diphtheria, and acellular pertussis (Td, Tdap) vaccine. Pregnant women should  receive 1 dose of Tdap vaccine during each pregnancy. The dose should be obtained regardless of the length of time since the last dose. Immunization is preferred during the 27th-36th week of gestation. An adult who has not previously received Tdap or who does not know her vaccine status should receive 1 dose of Tdap. This initial dose should be followed by tetanus and diphtheria toxoids (Td) booster doses every 10 years. Adults with an unknown or incomplete history of completing a 3-dose immunization series with Td-containing vaccines should begin or complete a primary immunization series including a Tdap dose. Adults should receive a Td booster every 10 years.  Varicella vaccine. An adult without evidence of immunity to varicella should receive 2 doses or a second dose if she has previously received 1 dose. Pregnant females who do not have evidence of immunity should receive the first dose after pregnancy. This first dose should be obtained before leaving the health care facility. The second dose should be obtained 4-8 weeks after the first dose.  Human papillomavirus (HPV) vaccine. Females aged 13-26 years who have not received the vaccine previously should obtain the 3-dose series. The vaccine is not recommended for use in pregnant females. However, pregnancy testing is not needed before receiving a dose. If a female is found to be pregnant after receiving a dose, no treatment is needed. In that case, the remaining doses should be delayed until after the pregnancy. Immunization is recommended for any person with an immunocompromised condition through the age of 26 years if she did not get any or all doses earlier. During the 3-dose series, the second dose should be obtained 4-8 weeks after the first dose. The third dose should be obtained 24 weeks after the first dose and 16 weeks after the second dose.  Zoster vaccine. One dose is recommended for adults aged 60 years or older unless certain conditions are  present.  Measles, mumps, and rubella (MMR) vaccine. Adults born before 1957 generally are considered immune to measles and mumps. Adults born in 1957 or later should have 1 or more doses of MMR vaccine unless there is a contraindication to the vaccine or there is laboratory evidence of immunity to   each of the three diseases. A routine second dose of MMR vaccine should be obtained at least 28 days after the first dose for students attending postsecondary schools, health care workers, or international travelers. People who received inactivated measles vaccine or an unknown type of measles vaccine during 1963-1967 should receive 2 doses of MMR vaccine. People who received inactivated mumps vaccine or an unknown type of mumps vaccine before 1979 and are at high risk for mumps infection should consider immunization with 2 doses of MMR vaccine. For females of childbearing age, rubella immunity should be determined. If there is no evidence of immunity, females who are not pregnant should be vaccinated. If there is no evidence of immunity, females who are pregnant should delay immunization until after pregnancy. Unvaccinated health care workers born before 1957 who lack laboratory evidence of measles, mumps, or rubella immunity or laboratory confirmation of disease should consider measles and mumps immunization with 2 doses of MMR vaccine or rubella immunization with 1 dose of MMR vaccine.  Pneumococcal 13-valent conjugate (PCV13) vaccine. When indicated, a person who is uncertain of her immunization history and has no record of immunization should receive the PCV13 vaccine. An adult aged 19 years or older who has certain medical conditions and has not been previously immunized should receive 1 dose of PCV13 vaccine. This PCV13 should be followed with a dose of pneumococcal polysaccharide (PPSV23) vaccine. The PPSV23 vaccine dose should be obtained at least 8 weeks after the dose of PCV13 vaccine. An adult aged 19  years or older who has certain medical conditions and previously received 1 or more doses of PPSV23 vaccine should receive 1 dose of PCV13. The PCV13 vaccine dose should be obtained 1 or more years after the last PPSV23 vaccine dose.  Pneumococcal polysaccharide (PPSV23) vaccine. When PCV13 is also indicated, PCV13 should be obtained first. All adults aged 65 years and older should be immunized. An adult younger than age 65 years who has certain medical conditions should be immunized. Any person who resides in a nursing home or long-term care facility should be immunized. An adult smoker should be immunized. People with an immunocompromised condition and certain other conditions should receive both PCV13 and PPSV23 vaccines. People with human immunodeficiency virus (HIV) infection should be immunized as soon as possible after diagnosis. Immunization during chemotherapy or radiation therapy should be avoided. Routine use of PPSV23 vaccine is not recommended for American Indians, [place] Natives, or people younger than 65 years unless there are medical conditions that require PPSV23 vaccine. When indicated, people who have unknown immunization and have no record of immunization should receive PPSV23 vaccine. One-time revaccination 5 years after the first dose of PPSV23 is recommended for people aged 19-64 years who have chronic kidney failure, nephrotic syndrome, asplenia, or immunocompromised conditions. People who received 1-2 doses of PPSV23 before age 65 years should receive another dose of PPSV23 vaccine at age 65 years or later if at least 5 years have passed since the previous dose. Doses of PPSV23 are not needed for people immunized with PPSV23 at or after age 65 years.  Meningococcal vaccine. Adults with asplenia or persistent complement component deficiencies should receive 2 doses of quadrivalent meningococcal conjugate (MenACWY-D) vaccine. The doses should be obtained at least 2 months apart.  Microbiologists working with certain meningococcal bacteria, military recruits, people at risk during an outbreak, and people who travel to or live in countries with a high rate of meningitis should be immunized. A first-year college student up through age   21 years who is living in a residence hall should receive a dose if she did not receive a dose on or after her 16th birthday. Adults who have certain high-risk conditions should receive one or more doses of vaccine.  Hepatitis A vaccine. Adults who wish to be protected from this disease, have certain high-risk conditions, work with hepatitis A-infected animals, work in hepatitis A research labs, or travel to or work in countries with a high rate of hepatitis A should be immunized. Adults who were previously unvaccinated and who anticipate close contact with an international adoptee during the first 60 days after arrival in the [place] from a country with a high rate of hepatitis A should be immunized.  Hepatitis B vaccine. Adults who wish to be protected from this disease, have certain high-risk conditions, may be exposed to blood or other infectious body fluids, are household contacts or sex partners of hepatitis B positive people, are clients or workers in certain care facilities, or travel to or work in countries with a high rate of hepatitis B should be immunized.  Haemophilus influenzae type b (Hib) vaccine. A previously unvaccinated person with asplenia or sickle cell disease or having a scheduled splenectomy should receive 1 dose of Hib vaccine. Regardless of previous immunization, a recipient of a hematopoietic stem cell transplant should receive a 3-dose series 6-12 months after her successful transplant. Hib vaccine is not recommended for adults with HIV infection. Preventive Services / Frequency Ages 64 to 68 years  Blood pressure check.** / Every 1 to 2 years.  Lipid and cholesterol check.** / Every 5 years beginning at age  22.  Clinical breast exam.** / Every 3 years for women in their 88s and 53s.  BRCA-related cancer risk assessment.** / For women who have family members with a BRCA-related cancer (breast, ovarian, tubal, or peritoneal cancers).  Pap test.** / Every 2 years from ages 90 through 51. Every 3 years starting at age 21 through age 56 or 3 with a history of 3 consecutive normal Pap tests.  HPV screening.** / Every 3 years from ages 24 through ages 1 to 46 with a history of 3 consecutive normal Pap tests.  Hepatitis C blood test.** / For any individual with known risks for hepatitis C.  Skin self-exam. / Monthly.  Influenza vaccine. / Every year.  Tetanus, diphtheria, and acellular pertussis (Tdap, Td) vaccine.** / Consult your health care provider. Pregnant women should receive 1 dose of Tdap vaccine during each pregnancy. 1 dose of Td every 10 years.  Varicella vaccine.** / Consult your health care provider. Pregnant females who do not have evidence of immunity should receive the first dose after pregnancy.  HPV vaccine. / 3 doses over 6 months, if 72 and younger. The vaccine is not recommended for use in pregnant females. However, pregnancy testing is not needed before receiving a dose.  Measles, mumps, rubella (MMR) vaccine.** / You need at least 1 dose of MMR if you were born in 1957 or later. You may also need a 2nd dose. For females of childbearing age, rubella immunity should be determined. If there is no evidence of immunity, females who are not pregnant should be vaccinated. If there is no evidence of immunity, females who are pregnant should delay immunization until after pregnancy.  Pneumococcal 13-valent conjugate (PCV13) vaccine.** / Consult your health care provider.  Pneumococcal polysaccharide (PPSV23) vaccine.** / 1 to 2 doses if you smoke cigarettes or if you have certain conditions.  Meningococcal vaccine.** /  1 dose if you are age 19 to 21 years and a first-year college  student living in a residence hall, or have one of several medical conditions, you need to get vaccinated against meningococcal disease. You may also need additional booster doses.  Hepatitis A vaccine.** / Consult your health care provider.  Hepatitis B vaccine.** / Consult your health care provider.  Haemophilus influenzae type b (Hib) vaccine.** / Consult your health care provider. Ages 40 to 64 years  Blood pressure check.** / Every 1 to 2 years.  Lipid and cholesterol check.** / Every 5 years beginning at age 20 years.  Lung cancer screening. / Every year if you are aged 55-80 years and have a 30-pack-year history of smoking and currently smoke or have quit within the past 15 years. Yearly screening is stopped once you have quit smoking for at least 15 years or develop a health problem that would prevent you from having lung cancer treatment.  Clinical breast exam.** / Every year after age 40 years.  BRCA-related cancer risk assessment.** / For women who have family members with a BRCA-related cancer (breast, ovarian, tubal, or peritoneal cancers).  Mammogram.** / Every year beginning at age 40 years and continuing for as long as you are in good health. Consult with your health care provider.  Pap test.** / Every 3 years starting at age 30 years through age 65 or 70 years with a history of 3 consecutive normal Pap tests.  HPV screening.** / Every 3 years from ages 30 years through ages 65 to 70 years with a history of 3 consecutive normal Pap tests.  Fecal occult blood test (FOBT) of stool. / Every year beginning at age 50 years and continuing until age 75 years. You may not need to do this test if you get a colonoscopy every 10 years.  Flexible sigmoidoscopy or colonoscopy.** / Every 5 years for a flexible sigmoidoscopy or every 10 years for a colonoscopy beginning at age 50 years and continuing until age 75 years.  Hepatitis C blood test.** / For all people born from 1945 through  1965 and any individual with known risks for hepatitis C.  Skin self-exam. / Monthly.  Influenza vaccine. / Every year.  Tetanus, diphtheria, and acellular pertussis (Tdap/Td) vaccine.** / Consult your health care provider. Pregnant women should receive 1 dose of Tdap vaccine during each pregnancy. 1 dose of Td every 10 years.  Varicella vaccine.** / Consult your health care provider. Pregnant females who do not have evidence of immunity should receive the first dose after pregnancy.  Zoster vaccine.** / 1 dose for adults aged 60 years or older.  Measles, mumps, rubella (MMR) vaccine.** / You need at least 1 dose of MMR if you were born in 1957 or later. You may also need a 2nd dose. For females of childbearing age, rubella immunity should be determined. If there is no evidence of immunity, females who are not pregnant should be vaccinated. If there is no evidence of immunity, females who are pregnant should delay immunization until after pregnancy.  Pneumococcal 13-valent conjugate (PCV13) vaccine.** / Consult your health care provider.  Pneumococcal polysaccharide (PPSV23) vaccine.** / 1 to 2 doses if you smoke cigarettes or if you have certain conditions.  Meningococcal vaccine.** / Consult your health care provider.  Hepatitis A vaccine.** / Consult your health care provider.  Hepatitis B vaccine.** / Consult your health care provider.  Haemophilus influenzae type b (Hib) vaccine.** / Consult your health care provider. Ages 65   years and over  Blood pressure check.** / Every 1 to 2 years.  Lipid and cholesterol check.** / Every 5 years beginning at age 22 years.  Lung cancer screening. / Every year if you are aged 73-80 years and have a 30-pack-year history of smoking and currently smoke or have quit within the past 15 years. Yearly screening is stopped once you have quit smoking for at least 15 years or develop a health problem that would prevent you from having lung cancer  treatment.  Clinical breast exam.** / Every year after age 4 years.  BRCA-related cancer risk assessment.** / For women who have family members with a BRCA-related cancer (breast, ovarian, tubal, or peritoneal cancers).  Mammogram.** / Every year beginning at age 40 years and continuing for as long as you are in good health. Consult with your health care provider.  Pap test.** / Every 3 years starting at age 9 years through age 34 or 91 years with 3 consecutive normal Pap tests. Testing can be stopped between 65 and 70 years with 3 consecutive normal Pap tests and no abnormal Pap or HPV tests in the past 10 years.  HPV screening.** / Every 3 years from ages 57 years through ages 64 or 45 years with a history of 3 consecutive normal Pap tests. Testing can be stopped between 65 and 70 years with 3 consecutive normal Pap tests and no abnormal Pap or HPV tests in the past 10 years.  Fecal occult blood test (FOBT) of stool. / Every year beginning at age 15 years and continuing until age 17 years. You may not need to do this test if you get a colonoscopy every 10 years.  Flexible sigmoidoscopy or colonoscopy.** / Every 5 years for a flexible sigmoidoscopy or every 10 years for a colonoscopy beginning at age 86 years and continuing until age 71 years.  Hepatitis C blood test.** / For all people born from [birth year] through 1966 and any individual with known risks for hepatitis C.  Osteoporosis screening.** / A one-time screening for women ages 83 years and over and women at risk for fractures or osteoporosis.  Skin self-exam. / Monthly.  Influenza vaccine. / Every year.  Tetanus, diphtheria, and acellular pertussis (Tdap/Td) vaccine.** / 1 dose of Td every 10 years.  Varicella vaccine.** / Consult your health care provider.  Zoster vaccine.** / 1 dose for adults aged 61 years or older.  Pneumococcal 13-valent conjugate (PCV13) vaccine.** / Consult your health care provider.  Pneumococcal  polysaccharide (PPSV23) vaccine.** / 1 dose for all adults aged 28 years and older.  Meningococcal vaccine.** / Consult your health care provider.  Hepatitis A vaccine.** / Consult your health care provider.  Hepatitis B vaccine.** / Consult your health care provider.  Haemophilus influenzae type b (Hib) vaccine.** / Consult your health care provider. ** Family history and personal history of risk and conditions may change your health care provider's recommendations. Document Released: 03/11/2001 Document Revised: 05/30/2013 Document Reviewed: 06/10/2010 [facility] Patient Information 2015 [place], [place]. This information is not intended to replace advice given to you by your health care provider. Make sure you discuss any questions you have with your health care provider.

## 2014-03-16 NOTE — Assessment & Plan Note (Signed)
She refused a flu vax Exam done PAP and mammo are UTD Labs ordered Pt ed material was given

## 2014-08-07 ENCOUNTER — Other Ambulatory Visit: Payer: Self-pay | Admitting: Internal Medicine

## 2014-08-07 NOTE — Telephone Encounter (Signed)
Patient is experiencing symptoms of heartburn and she was prescribed a year ago for [facility]. She was hoping you could call her in some to [facility] on [address].

## 2014-08-09 ENCOUNTER — Telehealth: Payer: Self-pay

## 2014-08-09 MED ORDER — DEXLANSOPRAZOLE 60 MG PO CPDR
60.0000 mg | DELAYED_RELEASE_CAPSULE | Freq: Every day | ORAL | Status: DC
Start: 2014-08-09 — End: 2015-07-29

## 2014-08-09 NOTE — Telephone Encounter (Signed)
LMOVM script was sent to pharmacy (dexilant)

## 2015-01-09 LAB — HM MAMMOGRAPHY

## 2015-01-09 LAB — HM DEXA SCAN

## 2015-01-09 LAB — HM PAP SMEAR

## 2015-01-15 ENCOUNTER — Other Ambulatory Visit: Payer: Self-pay | Admitting: Obstetrics & Gynecology

## 2015-01-15 DIAGNOSIS — R928 Other abnormal and inconclusive findings on diagnostic imaging of breast: Secondary | ICD-10-CM

## 2015-01-18 ENCOUNTER — Ambulatory Visit
Admission: RE | Admit: 2015-01-18 | Discharge: 2015-01-18 | Disposition: A | Discharge status: Home / Self Care | Payer: 59 | Source: Ambulatory Visit | Attending: Obstetrics & Gynecology | Admitting: Obstetrics & Gynecology

## 2015-01-18 DIAGNOSIS — R928 Other abnormal and inconclusive findings on diagnostic imaging of breast: Secondary | ICD-10-CM

## 2015-02-06 ENCOUNTER — Other Ambulatory Visit: Payer: Self-pay

## 2015-02-06 MED ORDER — DEXLANSOPRAZOLE 60 MG PO CPDR: 60.0000 mg | DELAYED_RELEASE_CAPSULE | Freq: Every day | ORAL | Status: DC

## 2015-03-19 ENCOUNTER — Other Ambulatory Visit (INDEPENDENT_AMBULATORY_CARE_PROVIDER_SITE_OTHER): Payer: Managed Care, Other (non HMO)

## 2015-03-19 ENCOUNTER — Ambulatory Visit (INDEPENDENT_AMBULATORY_CARE_PROVIDER_SITE_OTHER): Payer: Managed Care, Other (non HMO) | Admitting: Internal Medicine

## 2015-03-19 ENCOUNTER — Encounter: Payer: Self-pay | Admitting: Internal Medicine

## 2015-03-19 VITALS — BP 138/82 | HR 76 | Temp 98.2°F | Resp 16 | Ht 62.0 in | Wt 173.0 lb

## 2015-03-19 DIAGNOSIS — Z0000 Encounter for general adult medical examination without abnormal findings: Secondary | ICD-10-CM | POA: Diagnosis not present

## 2015-03-19 DIAGNOSIS — R7989 Other specified abnormal findings of blood chemistry: Secondary | ICD-10-CM | POA: Diagnosis not present

## 2015-03-19 LAB — LIPID PANEL
Cholesterol: 236 mg/dL — ABNORMAL HIGH (ref 0–200)
HDL: 75.8 mg/dL (ref >39.00)
NONHDL: 159.89
Total CHOL/HDL Ratio: 3
Triglycerides: 240 mg/dL — ABNORMAL HIGH (ref 0.0–149.0)
VLDL: 48 mg/dL — AB (ref 0.0–40.0)

## 2015-03-19 LAB — CBC WITH DIFFERENTIAL/PLATELET
BASOS PCT: 0 % (ref 0.0–3.0)
Basophils Absolute: 0 10*3/uL (ref 0.0–0.1)
EOS PCT: 2 % (ref 0.0–5.0)
Eosinophils Absolute: 0.2 10*3/uL (ref 0.0–0.7)
HCT: 34.7 % — ABNORMAL LOW (ref 36.0–46.0)
HEMOGLOBIN: 12 g/dL (ref 12.0–15.0)
Lymphocytes Relative: 18.8 % (ref 12.0–46.0)
Lymphs Abs: 1.4 10*3/uL (ref 0.7–4.0)
MCHC: 34.5 g/dL (ref 30.0–36.0)
MCV: 91.7 fl (ref 78.0–100.0)
MONO ABS: 0.5 10*3/uL (ref 0.1–1.0)
MONOS PCT: 6.8 % (ref 3.0–12.0)
Neutro Abs: 5.3 10*3/uL (ref 1.4–7.7)
Neutrophils Relative %: 72.4 % (ref 43.0–77.0)
Platelets: 200 10*3/uL (ref 150.0–400.0)
RBC: 3.79 Mil/uL — ABNORMAL LOW (ref 3.87–5.11)
RDW: 13.4 % (ref 11.5–15.5)
WBC: 7.4 10*3/uL (ref 4.0–10.5)

## 2015-03-19 LAB — COMPREHENSIVE METABOLIC PANEL
ALBUMIN: 4.5 g/dL (ref 3.5–5.2)
ALT: 27 U/L (ref 0–35)
AST: 23 U/L (ref 0–37)
Alkaline Phosphatase: 70 U/L (ref 39–117)
BUN: 19 mg/dL (ref 6–23)
CHLORIDE: 105 meq/L (ref 96–112)
CO2: 27 mEq/L (ref 19–32)
Calcium: 9.5 mg/dL (ref 8.4–10.5)
Creatinine, Ser: 0.76 mg/dL (ref 0.40–1.20)
GFR: 82.2 mL/min (ref >60.00)
GLUCOSE: 110 mg/dL — AB (ref 70–99)
POTASSIUM: 4.3 meq/L (ref 3.5–5.1)
SODIUM: 143 meq/L (ref 135–145)
Total Bilirubin: 0.3 mg/dL (ref 0.2–1.2)
Total Protein: 7 g/dL (ref 6.0–8.3)

## 2015-03-19 LAB — LDL CHOLESTEROL, DIRECT: Direct LDL: 140 mg/dL

## 2015-03-19 LAB — TSH: TSH: 1.19 u[IU]/mL (ref 0.35–4.50)

## 2015-03-19 NOTE — Progress Notes (Signed)
Pre visit review using our clinic review tool, if applicable. No additional management support is needed unless otherwise documented below in the visit note. 

## 2015-03-19 NOTE — Progress Notes (Signed)
Subjective:  Patient ID: Monica Barnes, female    DOB: 04/23/1955  Age: 60 y.o. MRN: [id number]  CC: Annual Exam   HPI Monica Barnes presents for a physical-her only complaint is the inability to lose weight. She saw her gynecologist, Dr. Nori Barnes, about 2 or 3 months ago and had a normal Pap smear, mammogram, and DEXA scan. She otherwise feels well and offers no complaints.  Outpatient Prescriptions Prior to Visit  Medication Sig Dispense Refill  . ALPRAZolam (XANAX) 0.5 MG tablet Take 0.5 mg by mouth 3 (three) times daily as needed.      . B Complex-C-Folic Acid (STRESS B COMPLEX PO) Once a day     . Carbonyl Iron (PERFECT IRON PO) Once a day     . dexlansoprazole (DEXILANT) 60 MG capsule Take 1 capsule (60 mg total) by mouth daily. 90 capsule 2  . FLUoxetine (PROZAC) 20 MG capsule Take 20 mg by mouth daily.      . Multiple Vitamin (MULTIVITAMIN) tablet Take 1 tablet by mouth daily.     No facility-administered medications prior to visit.    ROS Review of Systems  All other systems reviewed and are negative.   Objective:  BP 138/82 mmHg  Pulse 76  Temp(Src) 98.2 F (36.8 C) (Oral)  Resp 16  Ht 5\' 2"  (1.575 m)  Wt 173 lb (78.472 kg)  BMI 31.63 kg/m2  SpO2 98%  LMP 01/27/2009  BP Readings from Last 3 Encounters:  03/20/15 130/80  03/19/15 138/82  03/16/14 130/82    Wt Readings from Last 3 Encounters:  03/20/15 172 lb (78.019 kg)  03/19/15 173 lb (78.472 kg)  03/16/14 172 lb (78.019 kg)    Physical Exam  Constitutional: She is oriented to person, place, and time. No distress.  HENT:  Mouth/Throat: Oropharynx is clear and moist. No oropharyngeal exudate.  Eyes: Conjunctivae are normal. Right eye exhibits no discharge. Left eye exhibits no discharge. No scleral icterus.  Neck: Normal range of motion. Neck supple. No JVD present. No tracheal deviation present. No thyromegaly present.  Cardiovascular: Normal rate, regular rhythm, normal heart sounds and intact distal  pulses.  Exam reveals no gallop and no friction rub.   No murmur heard. Pulmonary/Chest: Effort normal and breath sounds normal. No stridor. No respiratory distress. She has no wheezes. She has no rales. She exhibits no tenderness.  Abdominal: Soft. Bowel sounds are normal. She exhibits no distension and no mass. There is no tenderness. There is no rebound and no guarding.  Musculoskeletal: Normal range of motion. She exhibits no edema or tenderness.  Lymphadenopathy:    She has no cervical adenopathy.  Neurological: She is oriented to person, place, and time.  Skin: Skin is warm and dry. No rash noted. She is not diaphoretic. No erythema. No pallor.  Psychiatric: She has a normal mood and affect. Her behavior is normal. Judgment and thought content normal.  Vitals reviewed.   Lab Results  Component Value Date   WBC 7.4 03/19/2015   HGB 12.0 03/19/2015   HCT 34.7* 03/19/2015   PLT 200.0 03/19/2015   GLUCOSE 110* 03/19/2015   CHOL 236* 03/19/2015   TRIG 240.0* 03/19/2015   HDL 75.80 03/19/2015   LDLDIRECT 140.0 03/19/2015   LDLCALC * 11/26/2007    120        Total Cholesterol/HDL:CHD Risk Coronary Heart Disease Risk Table                     Men  Women  1/2 Average Risk   3.4   3.3   ALT 27 03/19/2015   AST 23 03/19/2015   NA 143 03/19/2015   K 4.3 03/19/2015   CL 105 03/19/2015   CREATININE 0.76 03/19/2015   BUN 19 03/19/2015   CO2 27 03/19/2015   TSH 1.19 03/19/2015   HGBA1C  11/26/2007    5.6 (NOTE)   The ADA recommends the following therapeutic goal for glycemic   control related to Hgb A1C measurement:   Goal of Therapy:   < 7.0% Hgb A1C   Reference: American Diabetes Association: Clinical Practice   Recommendations 2008, Diabetes Care,  2008, 31:(Suppl 1).    Mm Diag Breast Tomo Uni Right  01/18/2015  CLINICAL DATA:  Possible distortion in the upper right breast on one of the oblique views of a recent 2D screening mammogram. EXAM: DIGITAL DIAGNOSTIC RIGHT  MAMMOGRAM WITH 3D TOMOSYNTHESIS AND CAD COMPARISON:  Previous exam(s). ACR Breast Density Category c: The breast tissue is heterogeneously dense, which may obscure small masses. FINDINGS: 3D tomographic images of the right breast demonstrate normal appearing fibroglandular tissue at the location of the recently suspected distortion in the upper portion of the breast. Mammographic images were processed with CAD. IMPRESSION: No evidence of malignancy. The recently suspected upper right breast distortion represented overlapping of normal breast tissue. RECOMMENDATION: Bilateral screening mammogram in 1 year. I have discussed the findings and recommendations with the patient. Results were also provided in writing at the conclusion of the visit. If applicable, a reminder letter will be sent to the patient regarding the next appointment. BI-RADS CATEGORY  1: Negative. Electronically Signed   By: Claudie Revering M.D.   On: 01/18/2015 14:59    Assessment & Plan:   Monica Barnes was seen today for annual exam.  Diagnoses and all orders for this visit:  Routine general medical examination at a health care facility- she refused a flu vaccine, her mammograms/Pap and DEXA scan are all up-to-date, labs ordered and reviewed, her Framingham risk score is low so I did not recommend a statin, her colonoscopy is up-to-date, patient education material was given. -     Lipid panel; Future -     Comprehensive metabolic panel; Future -     CBC with Differential/Platelet; Future -     TSH; Future  I am having Monica Barnes maintain her Carbonyl Iron (PERFECT IRON PO), ALPRAZolam, B Complex-C-Folic Acid (STRESS B COMPLEX PO), FLUoxetine, multivitamin, and dexlansoprazole.  No orders of the defined types were placed in this encounter.     Follow-up: Return in about 6 months (around 09/16/2015).  [name], MD

## 2015-03-19 NOTE — Patient Instructions (Signed)
Preventive Care for Adults, Female A healthy lifestyle and preventive care can promote health and wellness. Preventive health guidelines for women include the following key practices.  A routine yearly physical is a good way to check with your health care provider about your health and preventive screening. It is a chance to share any concerns and updates on your health and to receive a thorough exam.  Visit your dentist for a routine exam and preventive care every 6 months. Brush your teeth twice a day and floss once a day. Good oral hygiene prevents tooth decay and gum disease.  The frequency of eye exams is based on your age, health, family medical history, use of contact lenses, and other factors. Follow your health care provider's recommendations for frequency of eye exams.  Eat a healthy diet. Foods like vegetables, fruits, whole grains, low-fat dairy products, and lean protein foods contain the nutrients you need without too many calories. Decrease your intake of foods high in solid fats, added sugars, and salt. Eat the right amount of calories for you.Get information about a proper diet from your health care provider, if necessary.  Regular physical exercise is one of the most important things you can do for your health. Most adults should get at least 150 minutes of moderate-intensity exercise (any activity that increases your heart rate and causes you to sweat) each week. In addition, most adults need muscle-strengthening exercises on 2 or more days a week.  Maintain a healthy weight. The body mass index (BMI) is a screening tool to identify possible weight problems. It provides an estimate of body fat based on height and weight. Your health care provider can find your BMI and can help you achieve or maintain a healthy weight.For adults 20 years and older:  A BMI below 18.5 is considered underweight.  A BMI of 18.5 to 24.9 is normal.  A BMI of 25 to 29.9 is considered overweight.  A  BMI of 30 and above is considered obese.  Maintain normal blood lipids and cholesterol levels by exercising and minimizing your intake of saturated fat. Eat a balanced diet with plenty of fruit and vegetables. Blood tests for lipids and cholesterol should begin at age 45 and be repeated every 5 years. If your lipid or cholesterol levels are high, you are over 50, or you are at high risk for heart disease, you may need your cholesterol levels checked more frequently.Ongoing high lipid and cholesterol levels should be treated with medicines if diet and exercise are not working.  If you smoke, find out from your health care provider how to quit. If you do not use tobacco, do not start.  Lung cancer screening is recommended for adults aged 45-80 years who are at high risk for developing lung cancer because of a history of smoking. A yearly low-dose CT scan of the lungs is recommended for people who have at least a 30-pack-year history of smoking and are a current smoker or have quit within the past 15 years. A pack year of smoking is smoking an average of 1 pack of cigarettes a day for 1 year (for example: 1 pack a day for 30 years or 2 packs a day for 15 years). Yearly screening should continue until the smoker has stopped smoking for at least 15 years. Yearly screening should be stopped for people who develop a health problem that would prevent them from having lung cancer treatment.  If you are pregnant, do not drink alcohol. If you are  breastfeeding, be very cautious about drinking alcohol. If you are not pregnant and choose to drink alcohol, do not have more than 1 drink per day. One drink is considered to be 12 ounces (355 mL) of beer, 5 ounces (148 mL) of wine, or 1.5 ounces (44 mL) of liquor.  Avoid use of street drugs. Do not share needles with anyone. Ask for help if you need support or instructions about stopping the use of drugs.  High blood pressure causes heart disease and increases the risk  of stroke. Your blood pressure should be checked at least every 1 to 2 years. Ongoing high blood pressure should be treated with medicines if weight loss and exercise do not work.  If you are 55-79 years old, ask your health care provider if you should take aspirin to prevent strokes.  Diabetes screening is done by taking a blood sample to check your blood glucose level after you have not eaten for a certain period of time (fasting). If you are not overweight and you do not have risk factors for diabetes, you should be screened once every 3 years starting at age 45. If you are overweight or obese and you are 40-70 years of age, you should be screened for diabetes every year as part of your cardiovascular risk assessment.  Breast cancer screening is essential preventive care for women. You should practice "breast self-awareness." This means understanding the normal appearance and feel of your breasts and may include breast self-examination. Any changes detected, no matter how small, should be reported to a health care provider. Women in their 20s and 30s should have a clinical breast exam (CBE) by a health care provider as part of a regular health exam every 1 to 3 years. After age 40, women should have a CBE every year. Starting at age 40, women should consider having a mammogram (breast X-ray test) every year. Women who have a family history of breast cancer should talk to their health care provider about genetic screening. Women at a high risk of breast cancer should talk to their health care providers about having an MRI and a mammogram every year.  Breast cancer gene (BRCA)-related cancer risk assessment is recommended for women who have family members with BRCA-related cancers. BRCA-related cancers include breast, ovarian, tubal, and peritoneal cancers. Having family members with these cancers may be associated with an increased risk for harmful changes (mutations) in the breast cancer genes BRCA1 and  BRCA2. Results of the assessment will determine the need for genetic counseling and BRCA1 and BRCA2 testing.  Your health care provider may recommend that you be screened regularly for cancer of the pelvic organs (ovaries, uterus, and vagina). This screening involves a pelvic examination, including checking for microscopic changes to the surface of your cervix (Pap test). You may be encouraged to have this screening done every 3 years, beginning at age 21.  For women ages 30-65, health care providers may recommend pelvic exams and Pap testing every 3 years, or they may recommend the Pap and pelvic exam, combined with testing for human papilloma virus (HPV), every 5 years. Some types of HPV increase your risk of cervical cancer. Testing for HPV may also be done on women of any age with unclear Pap test results.  Other health care providers may not recommend any screening for nonpregnant women who are considered low risk for pelvic cancer and who do not have symptoms. Ask your health care provider if a screening pelvic exam is right for   you.  If you have had past treatment for cervical cancer or a condition that could lead to cancer, you need Pap tests and screening for cancer for at least 20 years after your treatment. If Pap tests have been discontinued, your risk factors (such as having a new sexual partner) need to be reassessed to determine if screening should resume. Some women have medical problems that increase the chance of getting cervical cancer. In these cases, your health care provider may recommend more frequent screening and Pap tests.  Colorectal cancer can be detected and often prevented. Most routine colorectal cancer screening begins at the age of 50 years and continues through age 75 years. However, your health care provider may recommend screening at an earlier age if you have risk factors for colon cancer. On a yearly basis, your health care provider may provide home test kits to check  for hidden blood in the stool. Use of a small camera at the end of a tube, to directly examine the colon (sigmoidoscopy or colonoscopy), can detect the earliest forms of colorectal cancer. Talk to your health care provider about this at age 50, when routine screening begins. Direct exam of the colon should be repeated every 5-10 years through age 75 years, unless early forms of precancerous polyps or small growths are found.  People who are at an increased risk for hepatitis B should be screened for this virus. You are considered at high risk for hepatitis B if:  You were born in a country where hepatitis B occurs often. Talk with your health care provider about which countries are considered high risk.  Your parents were born in a high-risk country and you have not received a shot to protect against hepatitis B (hepatitis B vaccine).  You have HIV or AIDS.  You use needles to inject street drugs.  You live with, or have sex with, someone who has hepatitis B.  You get hemodialysis treatment.  You take certain medicines for conditions like cancer, organ transplantation, and autoimmune conditions.  Hepatitis C blood testing is recommended for all people born from 1945 through 1965 and any individual with known risks for hepatitis C.  Practice safe sex. Use condoms and avoid high-risk sexual practices to reduce the spread of sexually transmitted infections (STIs). STIs include gonorrhea, chlamydia, syphilis, trichomonas, herpes, HPV, and human immunodeficiency virus (HIV). Herpes, HIV, and HPV are viral illnesses that have no cure. They can result in disability, cancer, and death.  You should be screened for sexually transmitted illnesses (STIs) including gonorrhea and chlamydia if:  You are sexually active and are younger than 24 years.  You are older than 24 years and your health care provider tells you that you are at risk for this type of infection.  Your sexual activity has changed  since you were last screened and you are at an increased risk for chlamydia or gonorrhea. Ask your health care provider if you are at risk.  If you are at risk of being infected with HIV, it is recommended that you take a prescription medicine daily to prevent HIV infection. This is called preexposure prophylaxis (PrEP). You are considered at risk if:  You are sexually active and do not regularly use condoms or know the HIV status of your partner(s).  You take drugs by injection.  You are sexually active with a partner who has HIV.  Talk with your health care provider about whether you are at high risk of being infected with HIV. If   you choose to begin PrEP, you should first be tested for HIV. You should then be tested every 3 months for as long as you are taking PrEP.  Osteoporosis is a disease in which the bones lose minerals and strength with aging. This can result in serious bone fractures or breaks. The risk of osteoporosis can be identified using a bone density scan. Women ages 67 years and over and women at risk for fractures or osteoporosis should discuss screening with their health care providers. Ask your health care provider whether you should take a calcium supplement or vitamin D to reduce the rate of osteoporosis.  Menopause can be associated with physical symptoms and risks. Hormone replacement therapy is available to decrease symptoms and risks. You should talk to your health care provider about whether hormone replacement therapy is right for you.  Use sunscreen. Apply sunscreen liberally and repeatedly throughout the day. You should seek shade when your shadow is shorter than you. Protect yourself by wearing long sleeves, pants, a wide-brimmed hat, and sunglasses year round, whenever you are outdoors.  Once a month, do a whole body skin exam, using a mirror to look at the skin on your back. Tell your health care provider of new moles, moles that have irregular borders, moles that  are larger than a pencil eraser, or moles that have changed in shape or color.  Stay current with required vaccines (immunizations).  Influenza vaccine. All adults should be immunized every year.  Tetanus, diphtheria, and acellular pertussis (Td, Tdap) vaccine. Pregnant women should receive 1 dose of Tdap vaccine during each pregnancy. The dose should be obtained regardless of the length of time since the last dose. Immunization is preferred during the 27th-36th week of gestation. An adult who has not previously received Tdap or who does not know her vaccine status should receive 1 dose of Tdap. This initial dose should be followed by tetanus and diphtheria toxoids (Td) booster doses every 10 years. Adults with an unknown or incomplete history of completing a 3-dose immunization series with Td-containing vaccines should begin or complete a primary immunization series including a Tdap dose. Adults should receive a Td booster every 10 years.  Varicella vaccine. An adult without evidence of immunity to varicella should receive 2 doses or a second dose if she has previously received 1 dose. Pregnant females who do not have evidence of immunity should receive the first dose after pregnancy. This first dose should be obtained before leaving the health care facility. The second dose should be obtained 4-8 weeks after the first dose.  Human papillomavirus (HPV) vaccine. Females aged 13-26 years who have not received the vaccine previously should obtain the 3-dose series. The vaccine is not recommended for use in pregnant females. However, pregnancy testing is not needed before receiving a dose. If a female is found to be pregnant after receiving a dose, no treatment is needed. In that case, the remaining doses should be delayed until after the pregnancy. Immunization is recommended for any person with an immunocompromised condition through the age of 61 years if she did not get any or all doses earlier. During the  3-dose series, the second dose should be obtained 4-8 weeks after the first dose. The third dose should be obtained 24 weeks after the first dose and 16 weeks after the second dose.  Zoster vaccine. One dose is recommended for adults aged 30 years or older unless certain conditions are present.  Measles, mumps, and rubella (MMR) vaccine. Adults born  before 1957 generally are considered immune to measles and mumps. Adults born in 1957 or later should have 1 or more doses of MMR vaccine unless there is a contraindication to the vaccine or there is laboratory evidence of immunity to each of the three diseases. A routine second dose of MMR vaccine should be obtained at least 28 days after the first dose for students attending postsecondary schools, health care workers, or international travelers. People who received inactivated measles vaccine or an unknown type of measles vaccine during 1963-1967 should receive 2 doses of MMR vaccine. People who received inactivated mumps vaccine or an unknown type of mumps vaccine before 1979 and are at high risk for mumps infection should consider immunization with 2 doses of MMR vaccine. For females of childbearing age, rubella immunity should be determined. If there is no evidence of immunity, females who are not pregnant should be vaccinated. If there is no evidence of immunity, females who are pregnant should delay immunization until after pregnancy. Unvaccinated health care workers born before 1957 who lack laboratory evidence of measles, mumps, or rubella immunity or laboratory confirmation of disease should consider measles and mumps immunization with 2 doses of MMR vaccine or rubella immunization with 1 dose of MMR vaccine.  Pneumococcal 13-valent conjugate (PCV13) vaccine. When indicated, a person who is uncertain of his immunization history and has no record of immunization should receive the PCV13 vaccine. All adults 65 years of age and older should receive this  vaccine. An adult aged 19 years or older who has certain medical conditions and has not been previously immunized should receive 1 dose of PCV13 vaccine. This PCV13 should be followed with a dose of pneumococcal polysaccharide (PPSV23) vaccine. Adults who are at high risk for pneumococcal disease should obtain the PPSV23 vaccine at least 8 weeks after the dose of PCV13 vaccine. Adults older than 61 years of age who have normal immune system function should obtain the PPSV23 vaccine dose at least 1 year after the dose of PCV13 vaccine.  Pneumococcal polysaccharide (PPSV23) vaccine. When PCV13 is also indicated, PCV13 should be obtained first. All adults aged 65 years and older should be immunized. An adult younger than age 65 years who has certain medical conditions should be immunized. Any person who resides in a nursing home or long-term care facility should be immunized. An adult smoker should be immunized. People with an immunocompromised condition and certain other conditions should receive both PCV13 and PPSV23 vaccines. People with human immunodeficiency virus (HIV) infection should be immunized as soon as possible after diagnosis. Immunization during chemotherapy or radiation therapy should be avoided. Routine use of PPSV23 vaccine is not recommended for American Indians, [place] Natives, or people younger than 65 years unless there are medical conditions that require PPSV23 vaccine. When indicated, people who have unknown immunization and have no record of immunization should receive PPSV23 vaccine. One-time revaccination 5 years after the first dose of PPSV23 is recommended for people aged 19-64 years who have chronic kidney failure, nephrotic syndrome, asplenia, or immunocompromised conditions. People who received 1-2 doses of PPSV23 before age 65 years should receive another dose of PPSV23 vaccine at age 65 years or later if at least 5 years have passed since the previous dose. Doses of PPSV23 are not  needed for people immunized with PPSV23 at or after age 65 years.  Meningococcal vaccine. Adults with asplenia or persistent complement component deficiencies should receive 2 doses of quadrivalent meningococcal conjugate (MenACWY-D) vaccine. The doses should be obtained   at least 2 months apart. Microbiologists working with certain meningococcal bacteria, [place] recruits, people at risk during an outbreak, and people who travel to or live in countries with a high rate of meningitis should be immunized. A first-year college student up through age 34 years who is living in a residence hall should receive a dose if she did not receive a dose on or after her 16th birthday. Adults who have certain high-risk conditions should receive one or more doses of vaccine.  Hepatitis A vaccine. Adults who wish to be protected from this disease, have certain high-risk conditions, work with hepatitis A-infected animals, work in hepatitis A research labs, or travel to or work in countries with a high rate of hepatitis A should be immunized. Adults who were previously unvaccinated and who anticipate close contact with an international adoptee during the first 60 days after arrival in the [place] from a country with a high rate of hepatitis A should be immunized.  Hepatitis B vaccine. Adults who wish to be protected from this disease, have certain high-risk conditions, may be exposed to blood or other infectious body fluids, are household contacts or sex partners of hepatitis B positive people, are clients or workers in certain care facilities, or travel to or work in countries with a high rate of hepatitis B should be immunized.  Haemophilus influenzae type b (Hib) vaccine. A previously unvaccinated person with asplenia or sickle cell disease or having a scheduled splenectomy should receive 1 dose of Hib vaccine. Regardless of previous immunization, a recipient of a hematopoietic stem cell transplant should receive a  3-dose series 6-12 months after her successful transplant. Hib vaccine is not recommended for adults with HIV infection. Preventive Services / Frequency Ages 35 to 4 years  Blood pressure check.** / Every 3-5 years.  Lipid and cholesterol check.** / Every 5 years beginning at age 60.  Clinical breast exam.** / Every 3 years for women in their 71s and 10s.  BRCA-related cancer risk assessment.** / For women who have family members with a BRCA-related cancer (breast, ovarian, tubal, or peritoneal cancers).  Pap test.** / Every 2 years from ages 76 through 26. Every 3 years starting at age 61 through age 76 or 93 with a history of 3 consecutive normal Pap tests.  HPV screening.** / Every 3 years from ages 37 through ages 60 to 51 with a history of 3 consecutive normal Pap tests.  Hepatitis C blood test.** / For any individual with known risks for hepatitis C.  Skin self-exam. / Monthly.  Influenza vaccine. / Every year.  Tetanus, diphtheria, and acellular pertussis (Tdap, Td) vaccine.** / Consult your health care provider. Pregnant women should receive 1 dose of Tdap vaccine during each pregnancy. 1 dose of Td every 10 years.  Varicella vaccine.** / Consult your health care provider. Pregnant females who do not have evidence of immunity should receive the first dose after pregnancy.  HPV vaccine. / 3 doses over 6 months, if 93 and younger. The vaccine is not recommended for use in pregnant females. However, pregnancy testing is not needed before receiving a dose.  Measles, mumps, rubella (MMR) vaccine.** / You need at least 1 dose of MMR if you were born in 1957 or later. You may also need a 2nd dose. For females of childbearing age, rubella immunity should be determined. If there is no evidence of immunity, females who are not pregnant should be vaccinated. If there is no evidence of immunity, females who are  pregnant should delay immunization until after pregnancy.  Pneumococcal  13-valent conjugate (PCV13) vaccine.** / Consult your health care provider.  Pneumococcal polysaccharide (PPSV23) vaccine.** / 1 to 2 doses if you smoke cigarettes or if you have certain conditions.  Meningococcal vaccine.** / 1 dose if you are age 68 to 8 years and a Market researcher living in a residence hall, or have one of several medical conditions, you need to get vaccinated against meningococcal disease. You may also need additional booster doses.  Hepatitis A vaccine.** / Consult your health care provider.  Hepatitis B vaccine.** / Consult your health care provider.  Haemophilus influenzae type b (Hib) vaccine.** / Consult your health care provider. Ages 7 to 53 years  Blood pressure check.** / Every year.  Lipid and cholesterol check.** / Every 5 years beginning at age 25 years.  Lung cancer screening. / Every year if you are aged 11-80 years and have a 30-pack-year history of smoking and currently smoke or have quit within the past 15 years. Yearly screening is stopped once you have quit smoking for at least 15 years or develop a health problem that would prevent you from having lung cancer treatment.  Clinical breast exam.** / Every year after age 48 years.  BRCA-related cancer risk assessment.** / For women who have family members with a BRCA-related cancer (breast, ovarian, tubal, or peritoneal cancers).  Mammogram.** / Every year beginning at age 41 years and continuing for as long as you are in good health. Consult with your health care provider.  Pap test.** / Every 3 years starting at age 65 years through age 37 or 70 years with a history of 3 consecutive normal Pap tests.  HPV screening.** / Every 3 years from ages 72 years through ages 60 to 40 years with a history of 3 consecutive normal Pap tests.  Fecal occult blood test (FOBT) of stool. / Every year beginning at age 21 years and continuing until age 5 years. You may not need to do this test if you get  a colonoscopy every 10 years.  Flexible sigmoidoscopy or colonoscopy.** / Every 5 years for a flexible sigmoidoscopy or every 10 years for a colonoscopy beginning at age 35 years and continuing until age 48 years.  Hepatitis C blood test.** / For all people born from [birth year] through 1965 and any individual with known risks for hepatitis C.  Skin self-exam. / Monthly.  Influenza vaccine. / Every year.  Tetanus, diphtheria, and acellular pertussis (Tdap/Td) vaccine.** / Consult your health care provider. Pregnant women should receive 1 dose of Tdap vaccine during each pregnancy. 1 dose of Td every 10 years.  Varicella vaccine.** / Consult your health care provider. Pregnant females who do not have evidence of immunity should receive the first dose after pregnancy.  Zoster vaccine.** / 1 dose for adults aged 30 years or older.  Measles, mumps, rubella (MMR) vaccine.** / You need at least 1 dose of MMR if you were born in 1957 or later. You may also need a second dose. For females of childbearing age, rubella immunity should be determined. If there is no evidence of immunity, females who are not pregnant should be vaccinated. If there is no evidence of immunity, females who are pregnant should delay immunization until after pregnancy.  Pneumococcal 13-valent conjugate (PCV13) vaccine.** / Consult your health care provider.  Pneumococcal polysaccharide (PPSV23) vaccine.** / 1 to 2 doses if you smoke cigarettes or if you have certain conditions.  Meningococcal vaccine.** /  Consult your health care provider.  Hepatitis A vaccine.** / Consult your health care provider.  Hepatitis B vaccine.** / Consult your health care provider.  Haemophilus influenzae type b (Hib) vaccine.** / Consult your health care provider. Ages 64 years and over  Blood pressure check.** / Every year.  Lipid and cholesterol check.** / Every 5 years beginning at age 23 years.  Lung cancer screening. / Every year if you  are aged 16-80 years and have a 30-pack-year history of smoking and currently smoke or have quit within the past 15 years. Yearly screening is stopped once you have quit smoking for at least 15 years or develop a health problem that would prevent you from having lung cancer treatment.  Clinical breast exam.** / Every year after age 74 years.  BRCA-related cancer risk assessment.** / For women who have family members with a BRCA-related cancer (breast, ovarian, tubal, or peritoneal cancers).  Mammogram.** / Every year beginning at age 44 years and continuing for as long as you are in good health. Consult with your health care provider.  Pap test.** / Every 3 years starting at age 58 years through age 22 or 39 years with 3 consecutive normal Pap tests. Testing can be stopped between 65 and 70 years with 3 consecutive normal Pap tests and no abnormal Pap or HPV tests in the past 10 years.  HPV screening.** / Every 3 years from ages 64 years through ages 70 or 61 years with a history of 3 consecutive normal Pap tests. Testing can be stopped between 65 and 70 years with 3 consecutive normal Pap tests and no abnormal Pap or HPV tests in the past 10 years.  Fecal occult blood test (FOBT) of stool. / Every year beginning at age 40 years and continuing until age 27 years. You may not need to do this test if you get a colonoscopy every 10 years.  Flexible sigmoidoscopy or colonoscopy.** / Every 5 years for a flexible sigmoidoscopy or every 10 years for a colonoscopy beginning at age 7 years and continuing until age 32 years.  Hepatitis C blood test.** / For all people born from [birth year] through 1965 and any individual with known risks for hepatitis C.  Osteoporosis screening.** / A one-time screening for women ages 30 years and over and women at risk for fractures or osteoporosis.  Skin self-exam. / Monthly.  Influenza vaccine. / Every year.  Tetanus, diphtheria, and acellular pertussis (Tdap/Td)  vaccine.** / 1 dose of Td every 10 years.  Varicella vaccine.** / Consult your health care provider.  Zoster vaccine.** / 1 dose for adults aged 35 years or older.  Pneumococcal 13-valent conjugate (PCV13) vaccine.** / Consult your health care provider.  Pneumococcal polysaccharide (PPSV23) vaccine.** / 1 dose for all adults aged 46 years and older.  Meningococcal vaccine.** / Consult your health care provider.  Hepatitis A vaccine.** / Consult your health care provider.  Hepatitis B vaccine.** / Consult your health care provider.  Haemophilus influenzae type b (Hib) vaccine.** / Consult your health care provider. ** Family history and personal history of risk and conditions may change your health care provider's recommendations.   This information is not intended to replace advice given to you by your health care provider. Make sure you discuss any questions you have with your health care provider.   Document Released: 03/11/2001 Document Revised: 02/03/2014 Document Reviewed: 06/10/2010 Elsevier Interactive Patient Education [employer].

## 2015-03-20 ENCOUNTER — Encounter: Payer: Self-pay | Admitting: Internal Medicine

## 2015-03-20 ENCOUNTER — Ambulatory Visit (INDEPENDENT_AMBULATORY_CARE_PROVIDER_SITE_OTHER): Payer: Managed Care, Other (non HMO) | Admitting: Internal Medicine

## 2015-03-20 VITALS — BP 130/80 | HR 82 | Temp 98.2°F | Resp 16 | Ht 61.0 in | Wt 172.0 lb

## 2015-03-20 DIAGNOSIS — L089 Local infection of the skin and subcutaneous tissue, unspecified: Secondary | ICD-10-CM | POA: Diagnosis not present

## 2015-03-20 MED ORDER — DOXYCYCLINE HYCLATE 100 MG PO TABS: 100.0000 mg | ORAL_TABLET | Freq: Two times a day (BID) | ORAL | Status: DC

## 2015-03-20 NOTE — Progress Notes (Signed)
1st [facility] OV She scratched her finger this weekend on a portable grill. Did well until awoke early am today with pain, swelling and redness. No fever  Exam BP 130/80 mmHg  Pulse 82  Temp(Src) 98.2 F (36.8 C) (Oral)  Resp 16  Ht 5\' 1"  (1.549 m)  Wt 172 lb (78.019 kg)  BMI 32.52 kg/m2  SpO2 98%  LMP 01/27/2009 Over index dip R has 1cm tense hemorrhagic bullae with small "scartch in middle. Joint not involved. Tender to pressure.  Opened w/18g/bloody d/c cultured  Imp 1/ Finger infection - Plan: Wound culture  Cult/start doxy

## 2015-03-23 LAB — WOUND CULTURE: GRAM STAIN: NONE SEEN

## 2015-07-29 ENCOUNTER — Other Ambulatory Visit: Payer: Self-pay | Admitting: Internal Medicine

## 2016-02-12 LAB — HM MAMMOGRAPHY: HM MAMMO: NORMAL (ref 0–4)

## 2016-02-12 LAB — HM PAP SMEAR

## 2016-02-20 ENCOUNTER — Other Ambulatory Visit: Payer: Self-pay | Admitting: Obstetrics & Gynecology

## 2016-02-20 DIAGNOSIS — N644 Mastodynia: Secondary | ICD-10-CM

## 2016-02-25 ENCOUNTER — Other Ambulatory Visit: Payer: Self-pay | Admitting: Obstetrics & Gynecology

## 2016-02-25 ENCOUNTER — Ambulatory Visit
Admission: RE | Admit: 2016-02-25 | Discharge: 2016-02-25 | Disposition: A | Discharge status: Home / Self Care | Payer: Managed Care, Other (non HMO) | Source: Ambulatory Visit | Attending: Obstetrics & Gynecology | Admitting: Obstetrics & Gynecology

## 2016-02-25 DIAGNOSIS — N644 Mastodynia: Secondary | ICD-10-CM

## 2016-04-03 ENCOUNTER — Telehealth: Payer: Self-pay

## 2016-04-03 ENCOUNTER — Other Ambulatory Visit (INDEPENDENT_AMBULATORY_CARE_PROVIDER_SITE_OTHER): Payer: 59

## 2016-04-03 ENCOUNTER — Encounter: Payer: Self-pay | Admitting: Internal Medicine

## 2016-04-03 ENCOUNTER — Ambulatory Visit (INDEPENDENT_AMBULATORY_CARE_PROVIDER_SITE_OTHER): Payer: 59 | Admitting: Internal Medicine

## 2016-04-03 VITALS — BP 140/80 | HR 84 | Temp 97.7°F | Ht 61.0 in | Wt 178.2 lb

## 2016-04-03 DIAGNOSIS — E781 Pure hyperglyceridemia: Secondary | ICD-10-CM | POA: Diagnosis not present

## 2016-04-03 DIAGNOSIS — R7989 Other specified abnormal findings of blood chemistry: Secondary | ICD-10-CM

## 2016-04-03 DIAGNOSIS — Z0000 Encounter for general adult medical examination without abnormal findings: Secondary | ICD-10-CM

## 2016-04-03 DIAGNOSIS — J45909 Unspecified asthma, uncomplicated: Secondary | ICD-10-CM | POA: Diagnosis not present

## 2016-04-03 DIAGNOSIS — R7303 Prediabetes: Secondary | ICD-10-CM | POA: Insufficient documentation

## 2016-04-03 DIAGNOSIS — R739 Hyperglycemia, unspecified: Secondary | ICD-10-CM | POA: Diagnosis not present

## 2016-04-03 DIAGNOSIS — Z0001 Encounter for general adult medical examination with abnormal findings: Secondary | ICD-10-CM

## 2016-04-03 DIAGNOSIS — R05 Cough: Secondary | ICD-10-CM

## 2016-04-03 DIAGNOSIS — R059 Cough, unspecified: Secondary | ICD-10-CM

## 2016-04-03 LAB — COMPREHENSIVE METABOLIC PANEL
ALBUMIN: 4.6 g/dL (ref 3.5–5.2)
ALK PHOS: 81 U/L (ref 39–117)
ALT: 49 U/L — ABNORMAL HIGH (ref 0–35)
AST: 36 U/L (ref 0–37)
BILIRUBIN TOTAL: 0.3 mg/dL (ref 0.2–1.2)
BUN: 19 mg/dL (ref 6–23)
CALCIUM: 9.8 mg/dL (ref 8.4–10.5)
CHLORIDE: 102 meq/L (ref 96–112)
CO2: 24 mEq/L (ref 19–32)
CREATININE: 0.73 mg/dL (ref 0.40–1.20)
GFR: 85.81 mL/min (ref >60.00)
Glucose, Bld: 101 mg/dL — ABNORMAL HIGH (ref 70–99)
POTASSIUM: 3.9 meq/L (ref 3.5–5.1)
Sodium: 137 mEq/L (ref 135–145)
Total Protein: 7.3 g/dL (ref 6.0–8.3)

## 2016-04-03 LAB — CBC WITH DIFFERENTIAL/PLATELET
BASOS ABS: 0.1 10*3/uL (ref 0.0–0.1)
Basophils Relative: 0.6 % (ref 0.0–3.0)
EOS PCT: 2.9 % (ref 0.0–5.0)
Eosinophils Absolute: 0.2 10*3/uL (ref 0.0–0.7)
HCT: 41.6 % (ref 36.0–46.0)
Hemoglobin: 14.1 g/dL (ref 12.0–15.0)
LYMPHS ABS: 1.6 10*3/uL (ref 0.7–4.0)
Lymphocytes Relative: 18.9 % (ref 12.0–46.0)
MCHC: 34 g/dL (ref 30.0–36.0)
MCV: 92.5 fl (ref 78.0–100.0)
MONO ABS: 0.6 10*3/uL (ref 0.1–1.0)
MONOS PCT: 7.4 % (ref 3.0–12.0)
NEUTROS ABS: 5.9 10*3/uL (ref 1.4–7.7)
Neutrophils Relative %: 70.2 % (ref 43.0–77.0)
PLATELETS: 220 10*3/uL (ref 150.0–400.0)
RBC: 4.49 Mil/uL (ref 3.87–5.11)
RDW: 12.8 % (ref 11.5–15.5)
WBC: 8.5 10*3/uL (ref 4.0–10.5)

## 2016-04-03 LAB — HEMOGLOBIN A1C: HEMOGLOBIN A1C: 6.2 % (ref 4.6–6.5)

## 2016-04-03 LAB — TSH: TSH: 1.6 u[IU]/mL (ref 0.35–4.50)

## 2016-04-03 LAB — POCT EXHALED NITRIC OXIDE: FENO LEVEL (PPB): 24

## 2016-04-03 MED ORDER — BUDESONIDE-FORMOTEROL FUMARATE 80-4.5 MCG/ACT IN AERO: 2.0000 | INHALATION_SPRAY | Freq: Two times a day (BID) | RESPIRATORY_TRACT | 5 refills | Status: DC

## 2016-04-03 NOTE — Telephone Encounter (Signed)
Order [id number]

## 2016-04-03 NOTE — Patient Instructions (Signed)
Health Maintenance, Female Adopting a healthy lifestyle and getting preventive care can go a long way to promote health and wellness. Talk with your health care provider about what schedule of regular examinations is right for you. This is a good chance for you to check in with your provider about disease prevention and staying healthy. In between checkups, there are plenty of things you can do on your own. Experts have done a lot of research about which lifestyle changes and preventive measures are most likely to keep you healthy. Ask your health care provider for more information. Weight and diet Eat a healthy diet  Be sure to include plenty of vegetables, fruits, low-fat dairy products, and lean protein.  Do not eat a lot of foods high in solid fats, added sugars, or salt.  Get regular exercise. This is one of the most important things you can do for your health.  Most adults should exercise for at least 150 minutes each week. The exercise should increase your heart rate and make you sweat (moderate-intensity exercise).  Most adults should also do strengthening exercises at least twice a week. This is in addition to the moderate-intensity exercise. Maintain a healthy weight  Body mass index (BMI) is a measurement that can be used to identify possible weight problems. It estimates body fat based on height and weight. Your health care provider can help determine your BMI and help you achieve or maintain a healthy weight.  For females 62 years of age and older:  A BMI below 18.5 is considered underweight.  A BMI of 18.5 to 24.9 is normal.  A BMI of 25 to 29.9 is considered overweight.  A BMI of 30 and above is considered obese. Watch levels of cholesterol and blood lipids  You should start having your blood tested for lipids and cholesterol at 62 years of age, then have this test every 5 years.  You may need to have your cholesterol levels checked more often if:  Your lipid or  cholesterol levels are high.  You are older than 62 years of age.  You are at high risk for heart disease. Cancer screening Lung Cancer  Lung cancer screening is recommended for adults 64-42 years old who are at high risk for lung cancer because of a history of smoking.  A yearly low-dose CT scan of the lungs is recommended for people who:  Currently smoke.  Have quit within the past 15 years.  Have at least a 30-pack-year history of smoking. A pack year is smoking an average of one pack of cigarettes a day for 1 year.  Yearly screening should continue until it has been 15 years since you quit.  Yearly screening should stop if you develop a health problem that would prevent you from having lung cancer treatment. Breast Cancer  Practice breast self-awareness. This means understanding how your breasts normally appear and feel.  It also means doing regular breast self-exams. Let your health care provider know about any changes, no matter how small.  If you are in your 20s or 30s, you should have a clinical breast exam (CBE) by a health care provider every 1-3 years as part of a regular health exam.  If you are 34 or older, have a CBE every year. Also consider having a breast X-ray (mammogram) every year.  If you have a family history of breast cancer, talk to your health care provider about genetic screening.  If you are at high risk for breast cancer, talk  to your health care provider about having an MRI and a mammogram every year.  Breast cancer gene (BRCA) assessment is recommended for women who have family members with BRCA-related cancers. BRCA-related cancers include:  Breast.  Ovarian.  Tubal.  Peritoneal cancers.  Results of the assessment will determine the need for genetic counseling and BRCA1 and BRCA2 testing. Cervical Cancer  Your health care provider may recommend that you be screened regularly for cancer of the pelvic organs (ovaries, uterus, and vagina).  This screening involves a pelvic examination, including checking for microscopic changes to the surface of your cervix (Pap test). You may be encouraged to have this screening done every 3 years, beginning at age 24.  For women ages 66-65, health care providers may recommend pelvic exams and Pap testing every 3 years, or they may recommend the Pap and pelvic exam, combined with testing for human papilloma virus (HPV), every 5 years. Some types of HPV increase your risk of cervical cancer. Testing for HPV may also be done on women of any age with unclear Pap test results.  Other health care providers may not recommend any screening for nonpregnant women who are considered low risk for pelvic cancer and who do not have symptoms. Ask your health care provider if a screening pelvic exam is right for you.  If you have had past treatment for cervical cancer or a condition that could lead to cancer, you need Pap tests and screening for cancer for at least 20 years after your treatment. If Pap tests have been discontinued, your risk factors (such as having a new sexual partner) need to be reassessed to determine if screening should resume. Some women have medical problems that increase the chance of getting cervical cancer. In these cases, your health care provider may recommend more frequent screening and Pap tests. Colorectal Cancer  This type of cancer can be detected and often prevented.  Routine colorectal cancer screening usually begins at 61 years of age and continues through 62 years of age.  Your health care provider may recommend screening at an earlier age if you have risk factors for colon cancer.  Your health care provider may also recommend using home test kits to check for hidden blood in the stool.  A small camera at the end of a tube can be used to examine your colon directly (sigmoidoscopy or colonoscopy). This is done to check for the earliest forms of colorectal cancer.  Routine  screening usually begins at age 41.  Direct examination of the colon should be repeated every 5-10 years through 62 years of age. However, you may need to be screened more often if early forms of precancerous polyps or small growths are found. Skin Cancer  Check your skin from head to toe regularly.  Tell your health care provider about any new moles or changes in moles, especially if there is a change in a mole's shape or color.  Also tell your health care provider if you have a mole that is larger than the size of a pencil eraser.  Always use sunscreen. Apply sunscreen liberally and repeatedly throughout the day.  Protect yourself by wearing long sleeves, pants, a wide-brimmed hat, and sunglasses whenever you are outside. Heart disease, diabetes, and high blood pressure  High blood pressure causes heart disease and increases the risk of stroke. High blood pressure is more likely to develop in:  People who have blood pressure in the high end of the normal range (130-139/85-89 mm Hg).  People who are overweight or obese.  People who are African American.  If you are 59-24 years of age, have your blood pressure checked every 3-5 years. If you are 34 years of age or older, have your blood pressure checked every year. You should have your blood pressure measured twice-once when you are at a hospital or clinic, and once when you are not at a hospital or clinic. Record the average of the two measurements. To check your blood pressure when you are not at a hospital or clinic, you can use:  An automated blood pressure machine at a pharmacy.  A home blood pressure monitor.  If you are between 29 years and 60 years old, ask your health care provider if you should take aspirin to prevent strokes.  Have regular diabetes screenings. This involves taking a blood sample to check your fasting blood sugar level.  If you are at a normal weight and have a low risk for diabetes, have this test once  every three years after 62 years of age.  If you are overweight and have a high risk for diabetes, consider being tested at a younger age or more often. Preventing infection Hepatitis B  If you have a higher risk for hepatitis B, you should be screened for this virus. You are considered at high risk for hepatitis B if:  You were born in a country where hepatitis B is common. Ask your health care provider which countries are considered high risk.  Your parents were born in a high-risk country, and you have not been immunized against hepatitis B (hepatitis B vaccine).  You have HIV or AIDS.  You use needles to inject street drugs.  You live with someone who has hepatitis B.  You have had sex with someone who has hepatitis B.  You get hemodialysis treatment.  You take certain medicines for conditions, including cancer, organ transplantation, and autoimmune conditions. Hepatitis C  Blood testing is recommended for:  Everyone born from [birth year] through 1966.  Anyone with known risk factors for hepatitis C. Sexually transmitted infections (STIs)  You should be screened for sexually transmitted infections (STIs) including gonorrhea and chlamydia if:  You are sexually active and are younger than 62 years of age.  You are older than 62 years of age and your health care provider tells you that you are at risk for this type of infection.  Your sexual activity has changed since you were last screened and you are at an increased risk for chlamydia or gonorrhea. Ask your health care provider if you are at risk.  If you do not have HIV, but are at risk, it may be recommended that you take a prescription medicine daily to prevent HIV infection. This is called pre-exposure prophylaxis (PrEP). You are considered at risk if:  You are sexually active and do not regularly use condoms or know the HIV status of your partner(s).  You take drugs by injection.  You are sexually active with a partner  who has HIV. Talk with your health care provider about whether you are at high risk of being infected with HIV. If you choose to begin PrEP, you should first be tested for HIV. You should then be tested every 3 months for as long as you are taking PrEP. Pregnancy  If you are premenopausal and you may become pregnant, ask your health care provider about preconception counseling.  If you may become pregnant, take 400 to 800 micrograms (mcg) of folic acid  every day.  If you want to prevent pregnancy, talk to your health care provider about birth control (contraception). Osteoporosis and menopause  Osteoporosis is a disease in which the bones lose minerals and strength with aging. This can result in serious bone fractures. Your risk for osteoporosis can be identified using a bone density scan.  If you are 4 years of age or older, or if you are at risk for osteoporosis and fractures, ask your health care provider if you should be screened.  Ask your health care provider whether you should take a calcium or vitamin D supplement to lower your risk for osteoporosis.  Menopause may have certain physical symptoms and risks.  Hormone replacement therapy may reduce some of these symptoms and risks. Talk to your health care provider about whether hormone replacement therapy is right for you. Follow these instructions at home:  Schedule regular health, dental, and eye exams.  Stay current with your immunizations.  Do not use any tobacco products including cigarettes, chewing tobacco, or electronic cigarettes.  If you are pregnant, do not drink alcohol.  If you are breastfeeding, limit how much and how often you drink alcohol.  Limit alcohol intake to no more than 1 drink per day for nonpregnant women. One drink equals 12 ounces of beer, 5 ounces of wine, or 1 ounces of hard liquor.  Do not use street drugs.  Do not share needles.  Ask your health care provider for help if you need support  or information about quitting drugs.  Tell your health care provider if you often feel depressed.  Tell your health care provider if you have ever been abused or do not feel safe at home. This information is not intended to replace advice given to you by your health care provider. Make sure you discuss any questions you have with your health care provider. Document Released: 07/29/2010 Document Revised: 06/21/2015 Document Reviewed: 10/17/2014 Elsevier Interactive Patient Education  2017 [employer].

## 2016-04-03 NOTE — Progress Notes (Signed)
Subjective:  Patient ID: Monica Barnes, female    DOB: [birth year]  Age: 61 y.o. MRN: [id number]  CC: Hyperlipidemia and Annual Exam   HPI Monica Barnes presents for a CPX.  She complains of intermittent wheezing, especially at night with rare nonproductive cough. She has not been recently using the Symbicort. She otherwise feels well and offers no other complaints.  Outpatient Medications Prior to Visit  Medication Sig Dispense Refill  . ALPRAZolam (XANAX) 0.5 MG tablet Take 0.5 mg by mouth 3 (three) times daily as needed.      . B Complex-C-Folic Acid (STRESS B COMPLEX PO) Once a day     . Carbonyl Iron (PERFECT IRON PO) Once a day     . dexlansoprazole (DEXILANT) 60 MG capsule Take 1 capsule (60 mg total) by mouth daily. 90 capsule 2  . FLUoxetine (PROZAC) 20 MG capsule Take 20 mg by mouth daily.      . Multiple Vitamin (MULTIVITAMIN) tablet Take 1 tablet by mouth daily.    Marland Kitchen DEXILANT 60 MG capsule TAKE ONE CAPSULE BY MOUTH EVERY DAY 30 capsule 6  . doxycycline (VIBRA-TABS) 100 MG tablet Take 1 tablet (100 mg total) by mouth 2 (two) times daily. 20 tablet 0   No facility-administered medications prior to visit.     ROS Review of Systems  Constitutional: Negative.  Negative for appetite change, chills, fatigue and fever.  HENT: Negative.  Negative for facial swelling and trouble swallowing.   Eyes: Negative for visual disturbance.  Respiratory: Positive for cough and wheezing. Negative for chest tightness, shortness of breath and stridor.   Cardiovascular: Negative for chest pain, palpitations and leg swelling.  Gastrointestinal: Negative for abdominal pain, constipation, diarrhea, nausea and vomiting.  Endocrine: Negative.   Genitourinary: Negative.  Negative for decreased urine volume, difficulty urinating, dysuria, frequency and urgency.  Musculoskeletal: Negative.  Negative for back pain, myalgias and neck pain.  Skin: Negative.  Negative for color change, pallor and rash.    Allergic/Immunologic: Negative.   Neurological: Negative.  Negative for dizziness, weakness, numbness and headaches.  Hematological: Negative.  Negative for adenopathy. Does not bruise/bleed easily.  Psychiatric/Behavioral: Negative.     Objective:  BP 140/80 (BP Location: Left Arm, Patient Position: Sitting, Cuff Size: Large)   Pulse 84   Temp 97.7 F (36.5 C) (Oral)   Ht 5\' 1"  (1.549 m)   Wt 178 lb 4 oz (80.9 kg)   LMP 01/27/2009   SpO2 98%   BMI 33.68 kg/m   BP Readings from Last 3 Encounters:  04/03/16 140/80  03/20/15 130/80  03/19/15 138/82    Wt Readings from Last 3 Encounters:  04/03/16 178 lb 4 oz (80.9 kg)  03/20/15 172 lb (78 kg)  03/19/15 173 lb (78.5 kg)    Physical Exam  Constitutional: She is oriented to person, place, and time. No distress.  HENT:  Mouth/Throat: Oropharynx is clear and moist. No oropharyngeal exudate.  Eyes: Conjunctivae are normal. Right eye exhibits no discharge. Left eye exhibits no discharge. No scleral icterus.  Neck: Normal range of motion. Neck supple. No JVD present. No tracheal deviation present. No thyromegaly present.  Cardiovascular: Normal rate, regular rhythm, normal heart sounds and intact distal pulses.  Exam reveals no gallop and no friction rub.   No murmur heard. Pulmonary/Chest: Effort normal and breath sounds normal. No stridor. No respiratory distress. She has no wheezes. She has no rales. She exhibits no tenderness.  Abdominal: Soft. Bowel sounds are normal. She exhibits  no distension and no mass. There is no tenderness. There is no rebound and no guarding.  Musculoskeletal: Normal range of motion. She exhibits no edema, tenderness or deformity.  Lymphadenopathy:    She has no cervical adenopathy.  Neurological: She is oriented to person, place, and time.  Skin: Skin is warm and dry. No rash noted. She is not diaphoretic. No erythema. No pallor.  Psychiatric: She has a normal mood and affect. Her behavior is  normal. Judgment and thought content normal.  Vitals reviewed.   Lab Results  Component Value Date   WBC 8.5 04/03/2016   HGB 14.1 04/03/2016   HCT 41.6 04/03/2016   PLT 220.0 04/03/2016   GLUCOSE 101 (H) 04/03/2016   CHOL 268 (H) 04/03/2016   TRIG (H) 04/03/2016    425.0 Triglyceride is over 400; calculations on Lipids are invalid.   HDL 64.30 04/03/2016   LDLDIRECT 161.0 04/03/2016   LDLCALC (H) 11/26/2007    120        Total Cholesterol/HDL:CHD Risk Coronary Heart Disease Risk Table                     Men   Women  1/2 Average Risk   3.4   3.3   ALT 49 (H) 04/03/2016   AST 36 04/03/2016   NA 137 04/03/2016   K 3.9 04/03/2016   CL 102 04/03/2016   CREATININE 0.73 04/03/2016   BUN 19 04/03/2016   CO2 24 04/03/2016   TSH 1.60 04/03/2016   HGBA1C 6.2 04/03/2016    Mm Diag Breast W/implant Tomo Bilateral  Result Date: 02/25/2016 CLINICAL DATA:  Intermittent tenderness to palpation in the axillary tail regions of both breasts, radiating to the axillae, for the past 3 months. EXAM: 2D DIGITAL DIAGNOSTIC BILATERAL MAMMOGRAM WITH IMPLANTS, CAD AND ADJUNCT TOMO The patient has retroglandular implants. Standard and implant displaced views were performed. COMPARISON:  Previous exam(s). ACR Breast Density Category c: The breast tissue is heterogeneously dense, which may obscure small masses. FINDINGS: 2D and 3D tomographic images of the breasts demonstrate bilateral extracapsular silicone. There are no interval findings suspicious for malignancy. Mammographic images were processed with CAD. IMPRESSION: Bilateral extracapsular silicone breast implant rupture. No evidence of malignancy. RECOMMENDATION: Bilateral screening mammogram in 1 year. I have discussed the findings and recommendations with the patient. Results were also provided in writing at the conclusion of the visit. If applicable, a reminder letter will be sent to the patient regarding the next appointment. BI-RADS CATEGORY  2:  Benign. Electronically Signed   By: Claudie Revering M.D.   On: 02/25/2016 13:35    Assessment & Plan:   Monica Barnes was seen today for hyperlipidemia and annual exam.  Diagnoses and all orders for this visit:  Routine general medical examination at a health care facility- exam completed, labs ordered and reviewed, will order Cologuard to screen for colon cancer, she tells me her Pap smear and mammogram are up-to-date, she refused a flu vaccine, education material was given. -     Lipid panel; Future -     Comprehensive metabolic panel; Future -     CBC with Differential/Platelet; Future -     TSH; Future  Elevated blood sugar- her A1c is up to 6.2%, she is prediabetic but no medications are needed at this time. I encouraged her to work on her lifestyle modifications and lose weight. -     Hemoglobin A1c; Future  Cough- her FeNO score is mildly elevated -  POCT EXHALED NITRIC OXIDE  Intrinsic asthma- I have asked her to restart the LABA/ICS combination inhaler -     budesonide-formoterol (SYMBICORT) 80-4.5 MCG/ACT inhaler; Inhale 2 puffs into the lungs 2 (two) times daily.  Pure hyperglyceridemia- her triglycerides are nearly 500 so in addition to lifestyle modifications have asked her to start taking omega-3 fish oils.   I have discontinued Monica Barnes's doxycycline and DEXILANT. I am also having her start on budesonide-formoterol and omega-3 acid ethyl esters. Additionally, I am having her maintain her Carbonyl Iron (PERFECT IRON PO), ALPRAZolam, B Complex-C-Folic Acid (STRESS B COMPLEX PO), FLUoxetine, multivitamin, and dexlansoprazole.  Meds ordered this encounter  Medications  . budesonide-formoterol (SYMBICORT) 80-4.5 MCG/ACT inhaler    Sig: Inhale 2 puffs into the lungs 2 (two) times daily.    Dispense:  1 Inhaler    Refill:  5  . omega-3 acid ethyl esters (LOVAZA) 1 g capsule    Sig: Take 2 capsules (2 g total) by mouth 2 (two) times daily.    Dispense:  120 capsule    Refill:   11     Follow-up: Return in about 3 months (around 07/04/2016).  [name], MD

## 2016-04-03 NOTE — Progress Notes (Signed)
Pre visit review using our clinic review tool, if applicable. No additional management support is needed unless otherwise documented below in the visit note. 

## 2016-04-04 ENCOUNTER — Encounter: Payer: Self-pay | Admitting: Internal Medicine

## 2016-04-04 LAB — LIPID PANEL
CHOLESTEROL: 268 mg/dL — AB (ref 0–200)
HDL: 64.3 mg/dL (ref >39.00)
Total CHOL/HDL Ratio: 4

## 2016-04-04 LAB — LDL CHOLESTEROL, DIRECT: Direct LDL: 161 mg/dL

## 2016-04-05 ENCOUNTER — Encounter: Payer: Self-pay | Admitting: Internal Medicine

## 2016-04-05 DIAGNOSIS — E781 Pure hyperglyceridemia: Secondary | ICD-10-CM | POA: Insufficient documentation

## 2016-04-05 MED ORDER — OMEGA-3-ACID ETHYL ESTERS 1 G PO CAPS
2.0000 g | ORAL_CAPSULE | Freq: Two times a day (BID) | ORAL | 11 refills | Status: DC
Start: 2016-04-05 — End: 2017-04-06

## 2016-04-20 ENCOUNTER — Other Ambulatory Visit: Payer: Self-pay | Admitting: Internal Medicine

## 2016-04-21 ENCOUNTER — Other Ambulatory Visit: Payer: Self-pay | Admitting: Internal Medicine

## 2016-04-21 DIAGNOSIS — K219 Gastro-esophageal reflux disease without esophagitis: Secondary | ICD-10-CM | POA: Insufficient documentation

## 2016-04-21 LAB — COLOGUARD: Cologuard: NEGATIVE

## 2016-04-21 MED ORDER — OMEPRAZOLE 20 MG PO CPDR: 20.0000 mg | DELAYED_RELEASE_CAPSULE | Freq: Every day | ORAL | 3 refills | Status: DC

## 2016-04-21 MED ORDER — ESOMEPRAZOLE MAGNESIUM 40 MG PO CPDR: 40.0000 mg | DELAYED_RELEASE_CAPSULE | Freq: Every day | ORAL | 2 refills | Status: DC

## 2017-01-29 ENCOUNTER — Encounter: Payer: Self-pay | Admitting: Gastroenterology

## 2017-02-10 ENCOUNTER — Other Ambulatory Visit: Payer: Self-pay

## 2017-02-10 DIAGNOSIS — J45909 Unspecified asthma, uncomplicated: Secondary | ICD-10-CM

## 2017-02-10 MED ORDER — BUDESONIDE-FORMOTEROL FUMARATE 80-4.5 MCG/ACT IN AERO: 2.0000 | INHALATION_SPRAY | Freq: Two times a day (BID) | RESPIRATORY_TRACT | 5 refills | Status: DC

## 2017-02-24 ENCOUNTER — Other Ambulatory Visit: Payer: Self-pay | Admitting: Internal Medicine

## 2017-02-26 LAB — HM MAMMOGRAPHY: HM MAMMO: NORMAL (ref 0–4)

## 2017-03-07 ENCOUNTER — Other Ambulatory Visit: Payer: Self-pay | Admitting: Internal Medicine

## 2017-03-07 DIAGNOSIS — J45909 Unspecified asthma, uncomplicated: Secondary | ICD-10-CM

## 2017-03-07 MED ORDER — BUDESONIDE-FORMOTEROL FUMARATE 80-4.5 MCG/ACT IN AERO: 2.0000 | INHALATION_SPRAY | Freq: Two times a day (BID) | RESPIRATORY_TRACT | 5 refills | Status: DC

## 2017-04-05 ENCOUNTER — Other Ambulatory Visit: Payer: Self-pay | Admitting: Internal Medicine

## 2017-04-05 DIAGNOSIS — E781 Pure hyperglyceridemia: Secondary | ICD-10-CM

## 2017-04-06 ENCOUNTER — Ambulatory Visit (INDEPENDENT_AMBULATORY_CARE_PROVIDER_SITE_OTHER): Payer: 59 | Admitting: Internal Medicine

## 2017-04-06 ENCOUNTER — Other Ambulatory Visit (INDEPENDENT_AMBULATORY_CARE_PROVIDER_SITE_OTHER): Payer: 59

## 2017-04-06 ENCOUNTER — Encounter: Payer: Self-pay | Admitting: Internal Medicine

## 2017-04-06 VITALS — BP 132/80 | HR 70 | Temp 97.9°F | Resp 16 | Ht 61.0 in | Wt 182.0 lb

## 2017-04-06 DIAGNOSIS — K219 Gastro-esophageal reflux disease without esophagitis: Secondary | ICD-10-CM | POA: Diagnosis not present

## 2017-04-06 DIAGNOSIS — Z1159 Encounter for screening for other viral diseases: Secondary | ICD-10-CM

## 2017-04-06 DIAGNOSIS — E66813 Obesity, class 3: Secondary | ICD-10-CM

## 2017-04-06 DIAGNOSIS — R945 Abnormal results of liver function studies: Secondary | ICD-10-CM | POA: Diagnosis not present

## 2017-04-06 DIAGNOSIS — Z23 Encounter for immunization: Secondary | ICD-10-CM | POA: Diagnosis not present

## 2017-04-06 DIAGNOSIS — E781 Pure hyperglyceridemia: Secondary | ICD-10-CM | POA: Diagnosis not present

## 2017-04-06 DIAGNOSIS — E785 Hyperlipidemia, unspecified: Secondary | ICD-10-CM

## 2017-04-06 DIAGNOSIS — R7303 Prediabetes: Secondary | ICD-10-CM

## 2017-04-06 DIAGNOSIS — R7989 Other specified abnormal findings of blood chemistry: Secondary | ICD-10-CM

## 2017-04-06 DIAGNOSIS — Z0000 Encounter for general adult medical examination without abnormal findings: Secondary | ICD-10-CM

## 2017-04-06 LAB — COMPREHENSIVE METABOLIC PANEL
ALT: 58 U/L — AB (ref 0–35)
AST: 39 U/L — ABNORMAL HIGH (ref 0–37)
Albumin: 4.8 g/dL (ref 3.5–5.2)
Alkaline Phosphatase: 68 U/L (ref 39–117)
BUN: 16 mg/dL (ref 6–23)
CO2: 27 meq/L (ref 19–32)
Calcium: 10.1 mg/dL (ref 8.4–10.5)
Chloride: 102 mEq/L (ref 96–112)
Creatinine, Ser: 0.76 mg/dL (ref 0.40–1.20)
GFR: 81.65 mL/min (ref >60.00)
GLUCOSE: 100 mg/dL — AB (ref 70–99)
POTASSIUM: 3.8 meq/L (ref 3.5–5.1)
SODIUM: 138 meq/L (ref 135–145)
Total Bilirubin: 0.3 mg/dL (ref 0.2–1.2)
Total Protein: 7.5 g/dL (ref 6.0–8.3)

## 2017-04-06 LAB — CBC WITH DIFFERENTIAL/PLATELET
Basophils Absolute: 0 K/uL (ref 0.0–0.1)
Basophils Relative: 0.4 % (ref 0.0–3.0)
Eosinophils Absolute: 0.2 K/uL (ref 0.0–0.7)
Eosinophils Relative: 1.9 % (ref 0.0–5.0)
HCT: 40.4 % (ref 36.0–46.0)
Hemoglobin: 13.8 g/dL (ref 12.0–15.0)
Lymphocytes Relative: 20.1 % (ref 12.0–46.0)
Lymphs Abs: 1.9 K/uL (ref 0.7–4.0)
MCHC: 34.2 g/dL (ref 30.0–36.0)
MCV: 92.8 fl (ref 78.0–100.0)
Monocytes Absolute: 0.7 K/uL (ref 0.1–1.0)
Monocytes Relative: 7.6 % (ref 3.0–12.0)
Neutro Abs: 6.5 K/uL (ref 1.4–7.7)
Neutrophils Relative %: 70 % (ref 43.0–77.0)
Platelets: 231 K/uL (ref 150.0–400.0)
RBC: 4.35 Mil/uL (ref 3.87–5.11)
RDW: 12.5 % (ref 11.5–15.5)
WBC: 9.2 K/uL (ref 4.0–10.5)

## 2017-04-06 LAB — LIPID PANEL
CHOL/HDL RATIO: 4
Cholesterol: 231 mg/dL — ABNORMAL HIGH (ref 0–200)
HDL: 59.1 mg/dL (ref >39.00)
NONHDL: 172.11
TRIGLYCERIDES: 202 mg/dL — AB (ref 0.0–149.0)
VLDL: 40.4 mg/dL — ABNORMAL HIGH (ref 0.0–40.0)

## 2017-04-06 LAB — LDL CHOLESTEROL, DIRECT: Direct LDL: 142 mg/dL

## 2017-04-06 LAB — HEMOGLOBIN A1C: Hgb A1c MFr Bld: 6.1 % (ref 4.6–6.5)

## 2017-04-06 MED ORDER — OMEGA-3-ACID ETHYL ESTERS 1 G PO CAPS: 2.0000 g | ORAL_CAPSULE | Freq: Two times a day (BID) | ORAL | 1 refills | Status: DC

## 2017-04-06 MED ORDER — INSULIN PEN NEEDLE 32G X 6 MM MISC: 1.0000 | Freq: Every day | 1 refills | Status: DC

## 2017-04-06 MED ORDER — LIRAGLUTIDE -WEIGHT MANAGEMENT 18 MG/3ML ~~LOC~~ SOPN: 3.0000 mg | PEN_INJECTOR | Freq: Every day | SUBCUTANEOUS | 5 refills | Status: DC

## 2017-04-06 NOTE — Patient Instructions (Signed)

## 2017-04-06 NOTE — Progress Notes (Signed)
Subjective:  Patient ID: Monica Barnes, female    DOB: 01-Aug-1955  Age: 62 y.o. MRN: [id number]  CC: Annual Exam; Hyperlipidemia; and Diabetes   HPI Monica Barnes presents for a CPX.  She complains that she has not been able to lose weight and wants to try a medication to help with this.   Past Medical History:  Diagnosis Date  . Anemia   . Asthma   . Depression   . Genital warts   . Headache(784.0)   . Heart murmur   . Restless legs 09/20/2010  . Upper airway resistance syndrome 09/03/2010   Past Surgical History:  Procedure Laterality Date  . APPENDECTOMY  1960  . removal birth mark  [place]   left arm  . TUBAL LIGATION  1983    reports that she has been smoking cigarettes.  She has a 12.00 pack-year smoking history. she has never used smokeless tobacco. She reports that she drinks about 12.6 oz of alcohol per week. She reports that she does not use drugs. family history includes Alcohol abuse in her father and other; Asthma in her mother; COPD in her mother; Drug abuse in her other; Emphysema in her mother; Heart disease in her maternal grandmother; Hypertension in her father; Liver cancer in her father; Lung cancer in her paternal uncle; Pancreatic cancer in her father; Prostate cancer in her maternal grandfather; Throat cancer in her father. Allergies  Allergen Reactions  . Penicillins Nausea And Vomiting  . Latex     Outpatient Medications Prior to Visit  Medication Sig Dispense Refill  . ALPRAZolam (XANAX) 0.5 MG tablet Take 0.5 mg by mouth 3 (three) times daily as needed.      . B Complex-C-Folic Acid (STRESS B COMPLEX PO) Once a day     . budesonide-formoterol (SYMBICORT) 80-4.5 MCG/ACT inhaler Inhale 2 puffs into the lungs 2 (two) times daily. 1 Inhaler 5  . Carbonyl Iron (PERFECT IRON PO) Once a day     . dexlansoprazole (DEXILANT) 60 MG capsule Take 60 mg by mouth daily.    Marland Kitchen FLUoxetine (PROZAC) 20 MG capsule Take 20 mg by mouth daily.      . Multiple  Vitamin (MULTIVITAMIN) tablet Take 1 tablet by mouth daily.    Marland Kitchen omega-3 acid ethyl esters (LOVAZA) 1 g capsule Take 2 capsules (2 g total) by mouth 2 (two) times daily. 120 capsule 11  . omeprazole (PRILOSEC) 20 MG capsule Take 1 capsule (20 mg total) by mouth daily. (Patient not taking: Reported on 04/06/2017) 90 capsule 3   No facility-administered medications prior to visit.     ROS Review of Systems  Constitutional: Positive for unexpected weight change. Negative for diaphoresis and fatigue.  HENT: Negative.  Negative for trouble swallowing.   Eyes: Negative for visual disturbance.  Respiratory: Negative.  Negative for cough, chest tightness, shortness of breath and wheezing.   Cardiovascular: Negative for chest pain, palpitations and leg swelling.  Gastrointestinal: Negative for abdominal pain, diarrhea, nausea and vomiting.  Endocrine: Negative.   Genitourinary: Negative.  Negative for difficulty urinating, vaginal bleeding and vaginal discharge.  Musculoskeletal: Negative.  Negative for arthralgias and myalgias.  Skin: Negative.  Negative for color change and pallor.  Allergic/Immunologic: Negative.   Neurological: Negative.  Negative for dizziness, weakness and light-headedness.  Hematological: Negative for adenopathy. Does not bruise/bleed easily.  Psychiatric/Behavioral: Negative.     Objective:  BP 132/80 (BP Location: Left Arm, Patient Position: Sitting, Cuff Size: Normal)   Pulse 70  Temp 97.9 F (36.6 C) (Oral)   Resp 16   Ht 5\' 1"  (1.549 m)   Wt 182 lb (82.6 kg)   LMP 01/27/2009   SpO2 99%   BMI 34.39 kg/m   BP Readings from Last 3 Encounters:  04/06/17 132/80  04/03/16 140/80  03/20/15 130/80    Wt Readings from Last 3 Encounters:  04/06/17 182 lb (82.6 kg)  04/03/16 178 lb 4 oz (80.9 kg)  03/20/15 172 lb (78 kg)    Physical Exam  Constitutional: She is oriented to person, place, and time. No distress.  HENT:  Mouth/Throat: Oropharynx is clear and  moist. No oropharyngeal exudate.  Eyes: Conjunctivae are normal. Left eye exhibits no discharge. No scleral icterus.  Neck: Normal range of motion. Neck supple. No JVD present. No thyromegaly present.  Cardiovascular: Normal rate, regular rhythm and normal heart sounds. Exam reveals no gallop and no friction rub.  No murmur heard. Pulmonary/Chest: Effort normal and breath sounds normal. No respiratory distress. She has no wheezes. She has no rales.  Abdominal: Soft. Bowel sounds are normal. She exhibits no distension and no mass. There is no tenderness. There is no guarding.  Musculoskeletal: Normal range of motion. She exhibits no edema, tenderness or deformity.  Lymphadenopathy:    She has no cervical adenopathy.  Neurological: She is alert and oriented to person, place, and time.  Skin: Skin is warm and dry. No rash noted. She is not diaphoretic. No erythema. No pallor.  Vitals reviewed.   Lab Results  Component Value Date   WBC 9.2 04/06/2017   HGB 13.8 04/06/2017   HCT 40.4 04/06/2017   PLT 231.0 04/06/2017   GLUCOSE 100 (H) 04/06/2017   CHOL 231 (H) 04/06/2017   TRIG 202.0 (H) 04/06/2017   HDL 59.10 04/06/2017   LDLDIRECT 142.0 04/06/2017   LDLCALC (H) 11/26/2007    120        Total Cholesterol/HDL:CHD Risk Coronary Heart Disease Risk Table                     Men   Women  1/2 Average Risk   3.4   3.3   ALT 58 (H) 04/06/2017   AST 39 (H) 04/06/2017   NA 138 04/06/2017   K 3.8 04/06/2017   CL 102 04/06/2017   CREATININE 0.76 04/06/2017   BUN 16 04/06/2017   CO2 27 04/06/2017   TSH 1.60 04/03/2016   HGBA1C 6.1 04/06/2017    Mm Diag Breast W/implant Tomo Bilateral  Result Date: 02/25/2016 CLINICAL DATA:  Intermittent tenderness to palpation in the axillary tail regions of both breasts, radiating to the axillae, for the past 3 months. EXAM: 2D DIGITAL DIAGNOSTIC BILATERAL MAMMOGRAM WITH IMPLANTS, CAD AND ADJUNCT TOMO The patient has retroglandular implants. Standard  and implant displaced views were performed. COMPARISON:  Previous exam(s). ACR Breast Density Category c: The breast tissue is heterogeneously dense, which may obscure small masses. FINDINGS: 2D and 3D tomographic images of the breasts demonstrate bilateral extracapsular silicone. There are no interval findings suspicious for malignancy. Mammographic images were processed with CAD. IMPRESSION: Bilateral extracapsular silicone breast implant rupture. No evidence of malignancy. RECOMMENDATION: Bilateral screening mammogram in 1 year. I have discussed the findings and recommendations with the patient. Results were also provided in writing at the conclusion of the visit. If applicable, a reminder letter will be sent to the patient regarding the next appointment. BI-RADS CATEGORY  2: Benign. Electronically Signed   By: Remo Lipps  Joneen Caraway M.D.   On: 02/25/2016 13:35    Assessment & Plan:   Burnette was seen today for annual exam, hyperlipidemia and diabetes.  Diagnoses and all orders for this visit:  Pure hyperglyceridemia- Her trigs are much better, down to 201.  Will continue the omega-3 fish oils and I have asked her to improve her lifestyle modifications. -     Lipid panel; Future -     omega-3 acid ethyl esters (LOVAZA) 1 g capsule; Take 2 capsules (2 g total) by mouth 2 (two) times daily.  Hyperlipidemia LDL goal <130- She has a mildly elevated ASCVD risk score.  I have asked her to start taking a statin for CV risk reduction. -     Comprehensive metabolic panel; Future -     Lipid panel; Future -     rosuvastatin (CRESTOR) 10 MG tablet; Take 1 tablet (10 mg total) by mouth daily.  Prediabetes- Her A1c is at 6.1%.  Medical therapy is not indicated.  She was encouraged to improve her lifestyle modifications. -     Comprehensive metabolic panel; Future -     Hemoglobin A1c; Future -     Liraglutide -Weight Management (SAXENDA) 18 MG/3ML SOPN; Inject 3 mg into the skin daily. -     Insulin Pen Needle  (NOVOFINE) 32G X 6 MM MISC; Inject 1 Act into the skin daily.  Class 3 severe obesity due to excess calories with serious comorbidity in adult, unspecified BMI ([place])- In addition to lifestyle modifications I have asked her to start using a high dose GLP-1 agonist to help with weight reduction. -     Liraglutide -Weight Management (SAXENDA) 18 MG/3ML SOPN; Inject 3 mg into the skin daily. -     Insulin Pen Needle (NOVOFINE) 32G X 6 MM MISC; Inject 1 Act into the skin daily.  GERD without esophagitis- Her symptoms are well controlled.  No complications noted. -     CBC with Differential/Platelet; Future  Need for hepatitis C screening test -     Hepatitis C antibody; Future  Routine general medical examination at a health care facility- Exam completed, labs reviewed, she refused a flu vaccine today, Pap/mammogram/colon cancer screening are all up-to-date, patient education material was given.  Other orders -     Pneumococcal polysaccharide vaccine 23-valent greater than or equal to 2yo subcutaneous/IM   I am having Monica Barnes start on Liraglutide -Weight Management, Insulin Pen Needle, and rosuvastatin. I am also having her maintain her Carbonyl Iron (PERFECT IRON PO), ALPRAZolam, B Complex-C-Folic Acid (STRESS B COMPLEX PO), FLUoxetine, multivitamin, omeprazole, budesonide-formoterol, dexlansoprazole, and omega-3 acid ethyl esters.  Meds ordered this encounter  Medications  . Liraglutide -Weight Management (SAXENDA) 18 MG/3ML SOPN    Sig: Inject 3 mg into the skin daily.    Dispense:  5 pen    Refill:  5  . Insulin Pen Needle (NOVOFINE) 32G X 6 MM MISC    Sig: Inject 1 Act into the skin daily.    Dispense:  100 each    Refill:  1  . omega-3 acid ethyl esters (LOVAZA) 1 g capsule    Sig: Take 2 capsules (2 g total) by mouth 2 (two) times daily.    Dispense:  360 capsule    Refill:  1  . rosuvastatin (CRESTOR) 10 MG tablet    Sig: Take 1 tablet (10 mg total) by mouth daily.     Dispense:  90 tablet    Refill:  1  See AVS for instructions about healthy living and anticipatory guidance.  Follow-up: Return in about 6 months (around 10/07/2017).  [name], MD

## 2017-04-07 ENCOUNTER — Encounter: Payer: Self-pay | Admitting: Internal Medicine

## 2017-04-07 ENCOUNTER — Telehealth: Payer: Self-pay

## 2017-04-07 LAB — HEPATITIS C ANTIBODY
Hepatitis C Ab: NONREACTIVE
SIGNAL TO CUT-OFF: 0.01

## 2017-04-07 NOTE — Telephone Encounter (Signed)
Monica Barnes is not covered by insurance and pt is requesting a different medication. Please advise.

## 2017-04-07 NOTE — Telephone Encounter (Signed)
Ask her to try the samples Please start a PA

## 2017-04-08 ENCOUNTER — Telehealth: Payer: Self-pay

## 2017-04-08 DIAGNOSIS — Z1159 Encounter for screening for other viral diseases: Secondary | ICD-10-CM | POA: Insufficient documentation

## 2017-04-08 DIAGNOSIS — K7581 Nonalcoholic steatohepatitis (NASH): Secondary | ICD-10-CM | POA: Insufficient documentation

## 2017-04-08 MED ORDER — ROSUVASTATIN CALCIUM 10 MG PO TABS: 10.0000 mg | ORAL_TABLET | Freq: Every day | ORAL | 1 refills | Status: DC

## 2017-04-08 NOTE — Telephone Encounter (Signed)
Her insurance company will have to let me know if they cover any products for the treatment of obesity

## 2017-04-08 NOTE — Telephone Encounter (Signed)
(  Routing message to Dr. Ronnald Ramp to address)  Received script from [facility] that [place] 18 mg/3 ml pen is not covered by insurance and they do not give prior auth as a solution. Patient is requesting to have new script.  Please advise.  Thanks

## 2017-04-09 NOTE — Telephone Encounter (Signed)
Pt insurance denied PA for saxenda and there is no formulary alternative for weight loss thru [employer] rx coverage. Please advise.

## 2017-04-09 NOTE — Telephone Encounter (Signed)
yes

## 2017-04-09 NOTE — Telephone Encounter (Signed)
Does her insurance coverage treatment of obesity?

## 2017-04-09 NOTE — Telephone Encounter (Signed)
There is no formulary for obesity treatment.  Do you think she would be approved for patient assistance, Good [facility] or the [facility] card?

## 2017-04-10 NOTE — Telephone Encounter (Signed)
Letter printed for appeal to insurance to approve coverage.

## 2017-04-14 NOTE — Telephone Encounter (Signed)
Pt contacted and she stated that the medication did not cost her anything. Closing note - pt will call back if something changes the next time that she picks up her prescription.

## 2017-04-20 ENCOUNTER — Other Ambulatory Visit: Payer: Self-pay | Admitting: Internal Medicine

## 2017-04-20 DIAGNOSIS — J4520 Mild intermittent asthma, uncomplicated: Secondary | ICD-10-CM

## 2017-04-20 DIAGNOSIS — J45909 Unspecified asthma, uncomplicated: Secondary | ICD-10-CM

## 2017-04-20 MED ORDER — BUDESONIDE-FORMOTEROL FUMARATE 80-4.5 MCG/ACT IN AERO: 2.0000 | INHALATION_SPRAY | Freq: Two times a day (BID) | RESPIRATORY_TRACT | 1 refills | Status: DC

## 2017-04-21 ENCOUNTER — Telehealth: Payer: Self-pay

## 2017-04-21 DIAGNOSIS — J45909 Unspecified asthma, uncomplicated: Secondary | ICD-10-CM

## 2017-04-21 DIAGNOSIS — J4520 Mild intermittent asthma, uncomplicated: Secondary | ICD-10-CM

## 2017-04-21 MED ORDER — BUDESONIDE-FORMOTEROL FUMARATE 80-4.5 MCG/ACT IN AERO: 2.0000 | INHALATION_SPRAY | Freq: Two times a day (BID) | RESPIRATORY_TRACT | 4 refills | Status: DC

## 2017-04-21 NOTE — Telephone Encounter (Signed)
[facility] is requesting an rx for Symbicort 80/4.5 mcg 2 puffs BID. #3 inhalers with 4 refills.  Rx printed as requested and mailed to [facility].

## 2017-05-05 ENCOUNTER — Encounter: Payer: Self-pay | Admitting: Internal Medicine

## 2017-05-06 ENCOUNTER — Encounter: Payer: Self-pay | Admitting: Internal Medicine

## 2017-05-06 DIAGNOSIS — R7303 Prediabetes: Secondary | ICD-10-CM

## 2017-05-20 IMAGING — MG 2D DIGITAL DIAGNOSTIC BILATERAL MAMMOGRAM WITH IMPLANTS, CAD AND
8 of 16 series · 8 of 32 positions shown · non-contrast
Comparison: Previous exam(s).

CLINICAL DATA: Intermittent tenderness to palpation in the axillary
tail regions of both breasts, radiating to the axillae, for the past
3 months.

EXAM:
2D DIGITAL DIAGNOSTIC BILATERAL MAMMOGRAM WITH IMPLANTS, CAD AND
ADJUNCT TOMO
The patient has retroglandular implants. Standard and implant
displaced views were performed.

[R MLO (1 of 2)]
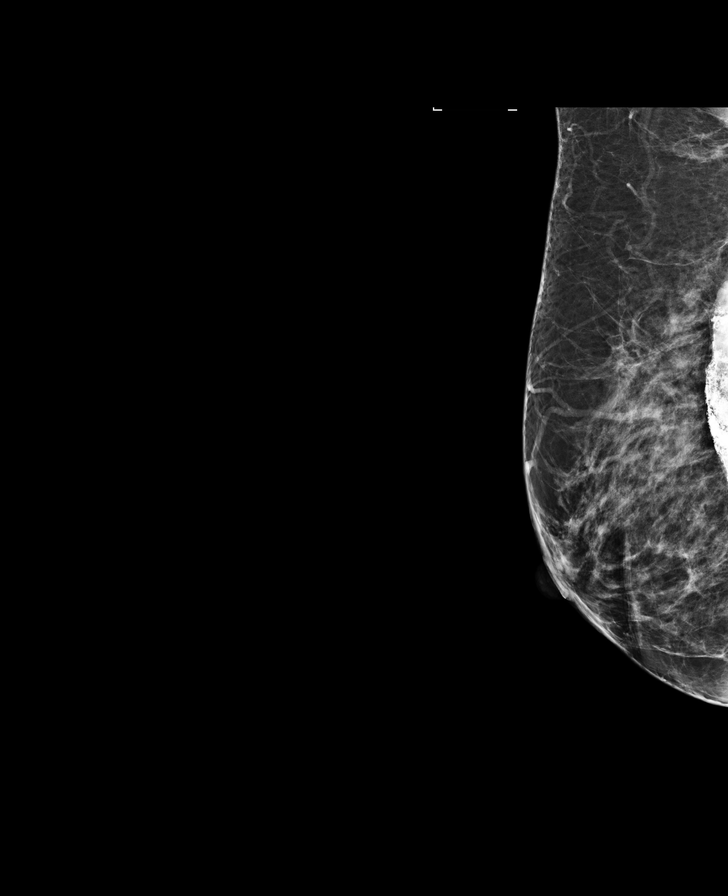

[R CC]
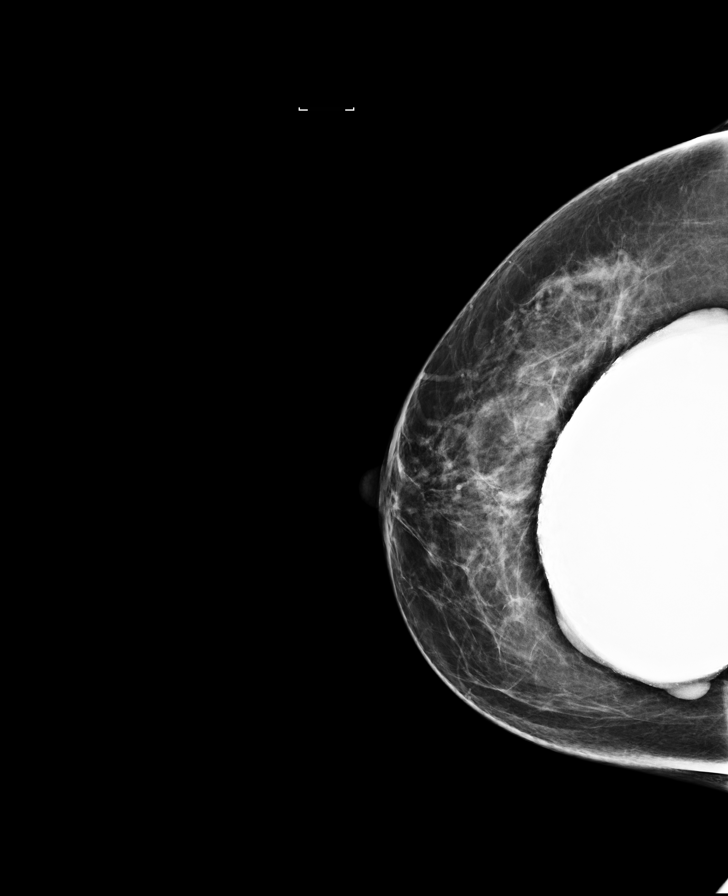

[L MLO]
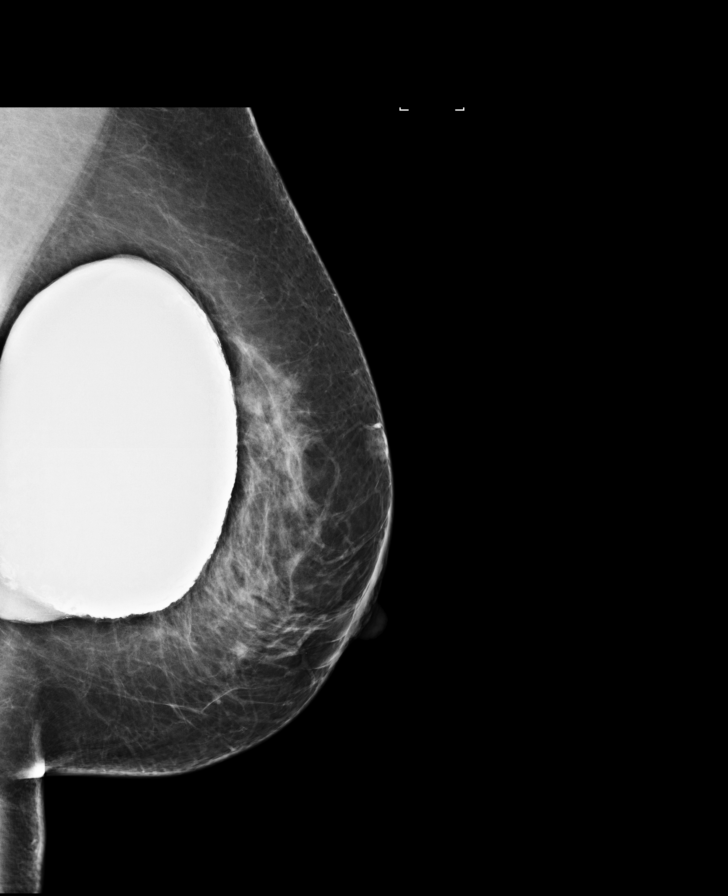

[L CC]
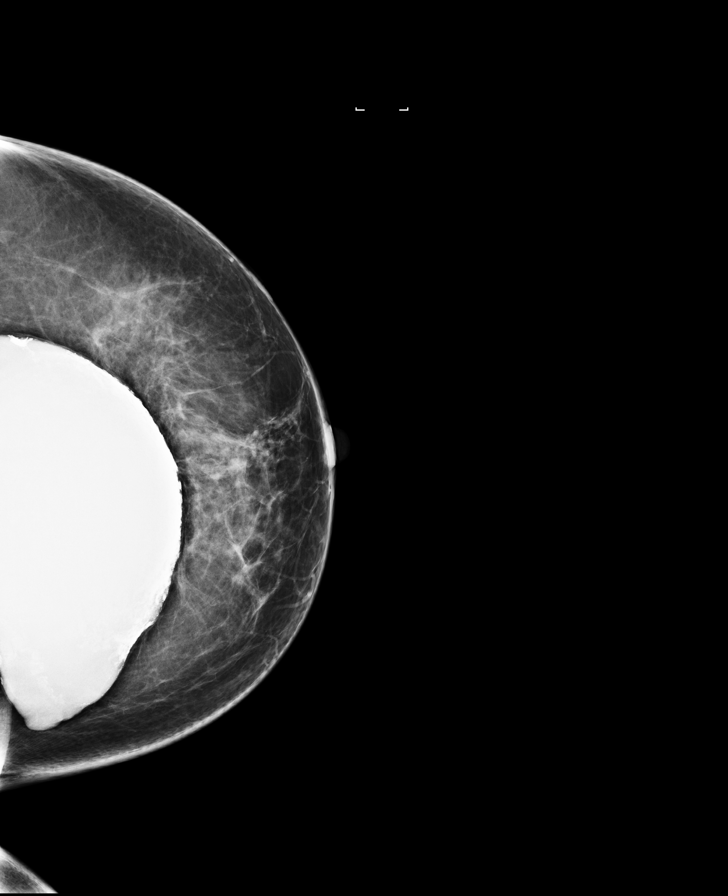

[R MLO (2 of 2)]
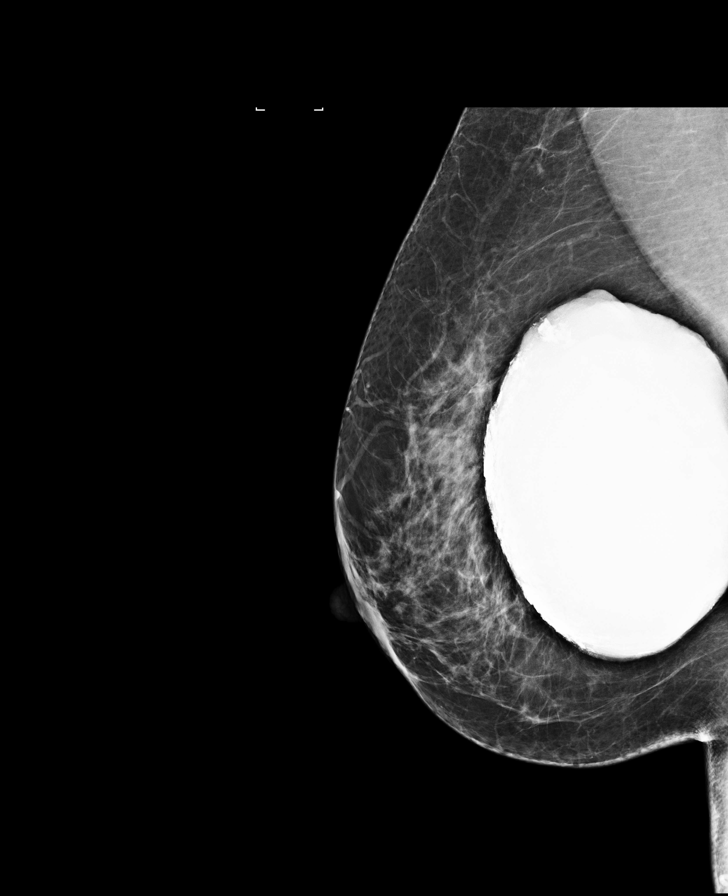

[L MLO synth-2D]
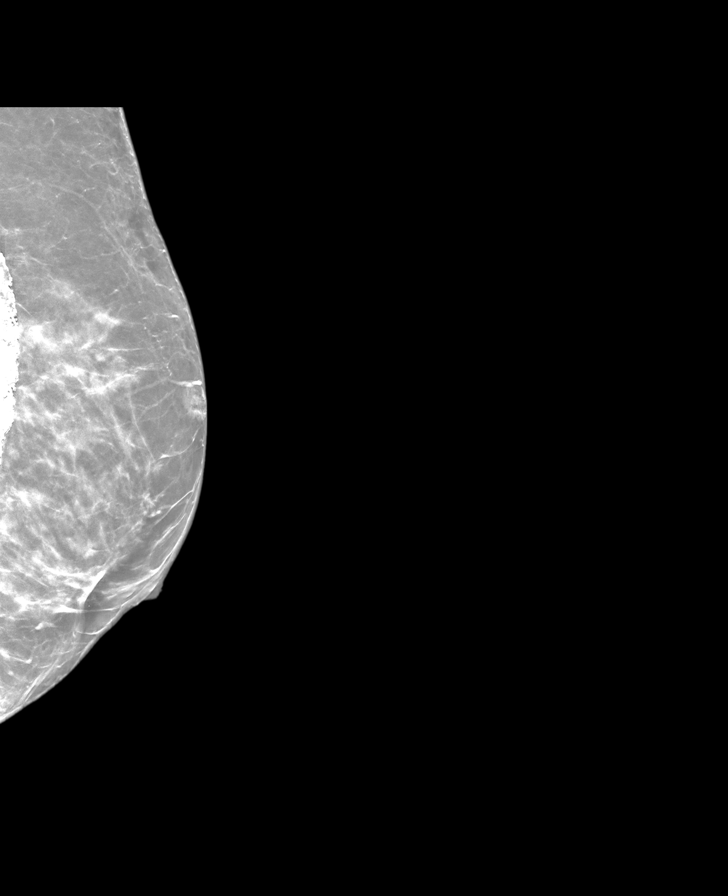

[R MLO synth-2D]
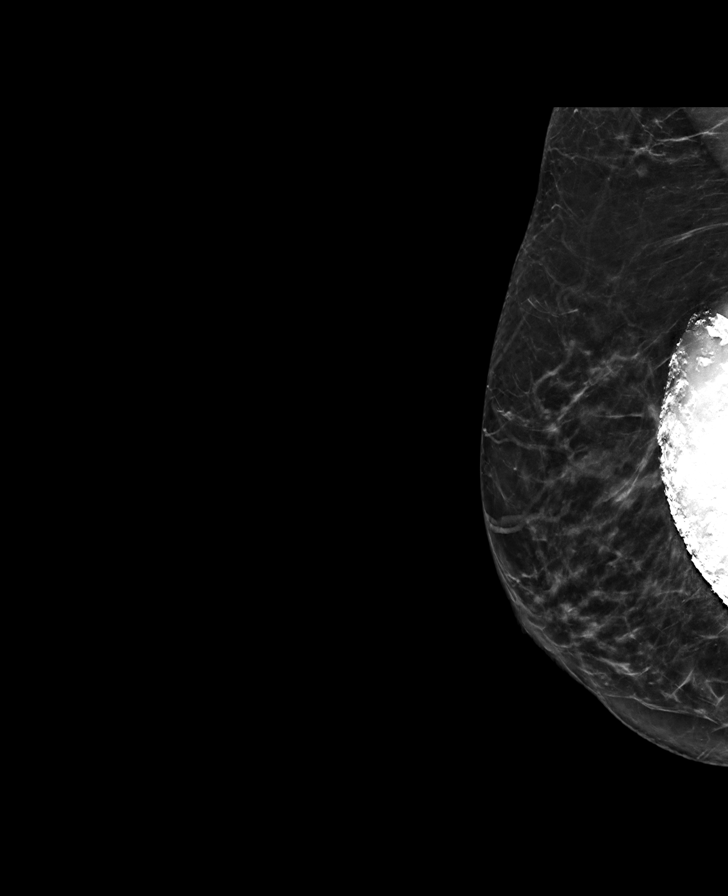

[L CC synth-2D]
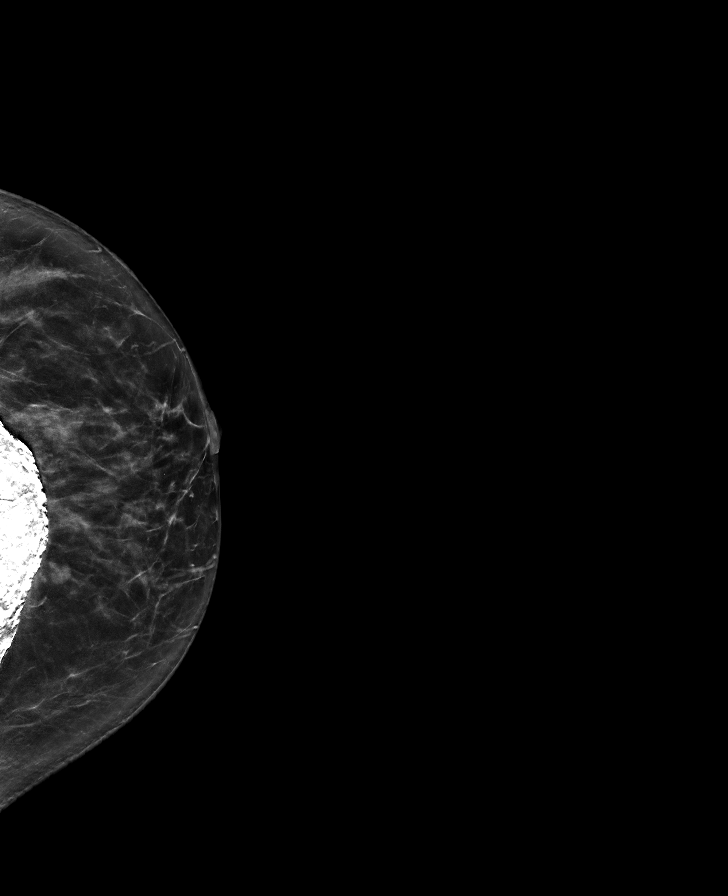

[8 of 32 positions shown; findings below may reference images not displayed]

ACR Breast Density Category c: The breast tissue is heterogeneously
dense, which may obscure small masses.
FINDINGS: 2D and 3D tomographic images of the breasts demonstrate bilateral
extracapsular silicone. There are no interval findings suspicious
for malignancy.

Mammographic images were processed with CAD.
IMPRESSION: Bilateral extracapsular silicone breast implant rupture. No evidence
of malignancy.

RECOMMENDATION:
Bilateral screening mammogram in 1 year.

I have discussed the findings and recommendations with the patient.
Results were also provided in writing at the conclusion of the
visit. If applicable, a reminder letter will be sent to the patient
regarding the next appointment.

BI-RADS CATEGORY  2: Benign.

## 2017-05-25 MED ORDER — LIRAGLUTIDE -WEIGHT MANAGEMENT 18 MG/3ML ~~LOC~~ SOPN: 3.0000 mg | PEN_INJECTOR | Freq: Every day | SUBCUTANEOUS | 5 refills | Status: DC

## 2017-05-25 NOTE — Telephone Encounter (Signed)
Patient Assistance Form completed and given to PCP for signature along with a printed rx to send to [facility].

## 2017-05-25 NOTE — Telephone Encounter (Signed)
Mailing patient assistance form with rx to address on file for pt.

## 2017-06-10 ENCOUNTER — Encounter: Payer: Self-pay | Admitting: Internal Medicine

## 2017-06-15 ENCOUNTER — Encounter: Payer: Self-pay | Admitting: Internal Medicine

## 2017-06-15 ENCOUNTER — Other Ambulatory Visit: Payer: Self-pay | Admitting: Internal Medicine

## 2017-07-07 ENCOUNTER — Other Ambulatory Visit: Payer: Self-pay | Admitting: Internal Medicine

## 2017-07-07 ENCOUNTER — Encounter: Payer: Self-pay | Admitting: Internal Medicine

## 2017-09-30 ENCOUNTER — Other Ambulatory Visit: Payer: Self-pay | Admitting: Internal Medicine

## 2017-09-30 DIAGNOSIS — E785 Hyperlipidemia, unspecified: Secondary | ICD-10-CM

## 2017-09-30 DIAGNOSIS — E781 Pure hyperglyceridemia: Secondary | ICD-10-CM

## 2018-04-07 ENCOUNTER — Other Ambulatory Visit: Payer: Self-pay | Admitting: Internal Medicine

## 2018-04-07 DIAGNOSIS — E785 Hyperlipidemia, unspecified: Secondary | ICD-10-CM

## 2018-04-08 ENCOUNTER — Other Ambulatory Visit (INDEPENDENT_AMBULATORY_CARE_PROVIDER_SITE_OTHER): Payer: 59

## 2018-04-08 ENCOUNTER — Encounter: Payer: Self-pay | Admitting: Internal Medicine

## 2018-04-08 ENCOUNTER — Ambulatory Visit (INDEPENDENT_AMBULATORY_CARE_PROVIDER_SITE_OTHER): Payer: 59 | Admitting: Internal Medicine

## 2018-04-08 ENCOUNTER — Other Ambulatory Visit: Payer: Self-pay

## 2018-04-08 VITALS — BP 136/86 | HR 78 | Temp 97.7°F | Resp 16 | Ht 61.0 in | Wt 189.0 lb

## 2018-04-08 DIAGNOSIS — Z114 Encounter for screening for human immunodeficiency virus [HIV]: Secondary | ICD-10-CM

## 2018-04-08 DIAGNOSIS — R7303 Prediabetes: Secondary | ICD-10-CM

## 2018-04-08 DIAGNOSIS — E781 Pure hyperglyceridemia: Secondary | ICD-10-CM

## 2018-04-08 DIAGNOSIS — Z0000 Encounter for general adult medical examination without abnormal findings: Secondary | ICD-10-CM

## 2018-04-08 DIAGNOSIS — E785 Hyperlipidemia, unspecified: Secondary | ICD-10-CM

## 2018-04-08 DIAGNOSIS — K7581 Nonalcoholic steatohepatitis (NASH): Secondary | ICD-10-CM | POA: Diagnosis not present

## 2018-04-08 LAB — CBC WITH DIFFERENTIAL/PLATELET
Basophils Absolute: 0 10*3/uL (ref 0.0–0.1)
Basophils Relative: 0.3 % (ref 0.0–3.0)
Eosinophils Absolute: 0.2 10*3/uL (ref 0.0–0.7)
Eosinophils Relative: 3 % (ref 0.0–5.0)
HCT: 39.5 % (ref 36.0–46.0)
Hemoglobin: 13.3 g/dL (ref 12.0–15.0)
Lymphocytes Relative: 29.4 % (ref 12.0–46.0)
Lymphs Abs: 1.6 10*3/uL (ref 0.7–4.0)
MCHC: 33.6 g/dL (ref 30.0–36.0)
MCV: 93.2 fl (ref 78.0–100.0)
MONOS PCT: 8.5 % (ref 3.0–12.0)
Monocytes Absolute: 0.5 10*3/uL (ref 0.1–1.0)
Neutro Abs: 3.3 10*3/uL (ref 1.4–7.7)
Neutrophils Relative %: 58.8 % (ref 43.0–77.0)
Platelets: 220 10*3/uL (ref 150.0–400.0)
RBC: 4.24 Mil/uL (ref 3.87–5.11)
RDW: 13.3 % (ref 11.5–15.5)
WBC: 5.6 10*3/uL (ref 4.0–10.5)

## 2018-04-08 LAB — HEPATIC FUNCTION PANEL
ALT: 25 U/L (ref 0–35)
AST: 27 U/L (ref 0–37)
Albumin: 4.7 g/dL (ref 3.5–5.2)
Alkaline Phosphatase: 77 U/L (ref 39–117)
Bilirubin, Direct: 0 mg/dL (ref 0.0–0.3)
Total Bilirubin: 0.3 mg/dL (ref 0.2–1.2)
Total Protein: 7.3 g/dL (ref 6.0–8.3)

## 2018-04-08 LAB — LIPID PANEL
CHOLESTEROL: 170 mg/dL (ref 0–200)
HDL: 74.4 mg/dL (ref >39.00)
LDL Cholesterol: 57 mg/dL (ref 0–99)
NonHDL: 95.8
Total CHOL/HDL Ratio: 2
Triglycerides: 195 mg/dL — ABNORMAL HIGH (ref 0.0–149.0)
VLDL: 39 mg/dL (ref 0.0–40.0)

## 2018-04-08 LAB — BASIC METABOLIC PANEL
BUN: 16 mg/dL (ref 6–23)
CO2: 25 mEq/L (ref 19–32)
Calcium: 9.3 mg/dL (ref 8.4–10.5)
Chloride: 107 mEq/L (ref 96–112)
Creatinine, Ser: 0.7 mg/dL (ref 0.40–1.20)
GFR: 84.2 mL/min (ref >60.00)
GLUCOSE: 94 mg/dL (ref 70–99)
Potassium: 4 mEq/L (ref 3.5–5.1)
SODIUM: 141 meq/L (ref 135–145)

## 2018-04-08 LAB — HEMOGLOBIN A1C: Hgb A1c MFr Bld: 6.2 % (ref 4.6–6.5)

## 2018-04-08 LAB — TSH: TSH: 1.32 u[IU]/mL (ref 0.35–4.50)

## 2018-04-08 LAB — GAMMA GT: GGT: 43 U/L (ref 7–51)

## 2018-04-08 NOTE — Patient Instructions (Signed)

## 2018-04-08 NOTE — Progress Notes (Signed)
Subjective:  Patient ID: Monica Barnes, female    DOB: Jul 02, 1955  Age: 63 y.o. MRN: [id number]  CC: Annual Exam   HPI Monica Barnes presents for a CPX.  She complains of weight gain but otherwise feels well and offers no other complaints.  She has been working on her lifestyle modifications with diet and exercise.  She tells me her heartburn has been well controlled and she has been able to stop taking the PPI.  She is tolerating the statin with no muscle aches.  She has had no recent exacerbations of asthma.  History Monica Barnes has a past medical history of Anemia, Asthma, Depression, Genital warts, Headache(784.0), Heart murmur, Restless legs (09/20/2010), and Upper airway resistance syndrome (09/03/2010).   She has a past surgical history that includes Appendectomy (1960); Tubal ligation (1983); and removal birth mark (1961 and 1963).   Her family history includes Alcohol abuse in her father and another family member; Asthma in her mother; COPD in her mother; Drug abuse in an other family member; Emphysema in her mother; Heart disease in her maternal grandmother; Hypertension in her father; Liver cancer in her father; Lung cancer in her paternal uncle; Pancreatic cancer in her father; Prostate cancer in her maternal grandfather; Throat cancer in her father.She reports that she has been smoking cigarettes. She has a 12.00 pack-year smoking history. She has never used smokeless tobacco. She reports current alcohol use of about 28.0 standard drinks of alcohol per week. She reports that she does not use drugs.  Outpatient Medications Prior to Visit  Medication Sig Dispense Refill  . ALPRAZolam (XANAX) 0.5 MG tablet Take 0.5 mg by mouth 3 (three) times daily as needed.      . B Complex-C-Folic Acid (STRESS B COMPLEX PO) Once a day     . budesonide-formoterol (SYMBICORT) 80-4.5 MCG/ACT inhaler Inhale 2 puffs into the lungs 2 (two) times daily. 3 Inhaler 4  . Carbonyl Iron (PERFECT IRON PO) Once a day      . dexlansoprazole (DEXILANT) 60 MG capsule Take 60 mg by mouth daily.    Marland Kitchen FLUoxetine (PROZAC) 20 MG capsule Take 20 mg by mouth daily.      Marland Kitchen omega-3 acid ethyl esters (LOVAZA) 1 g capsule TAKE 2 CAPSULES (2 G TOTAL) BY MOUTH 2 (TWO) TIMES DAILY. 360 capsule 1  . rosuvastatin (CRESTOR) 10 MG tablet TAKE 1 TABLET BY MOUTH EVERY DAY 30 tablet 0  . omeprazole (PRILOSEC) 20 MG capsule Take 1 capsule (20 mg total) by mouth daily. 90 capsule 3   No facility-administered medications prior to visit.     ROS Review of Systems  Constitutional: Negative.  Negative for diaphoresis, fatigue and fever.  HENT: Negative.   Eyes: Negative for visual disturbance.  Respiratory: Negative for cough, chest tightness, shortness of breath and wheezing.   Cardiovascular: Negative for chest pain, palpitations and leg swelling.  Gastrointestinal: Negative for abdominal pain, constipation, diarrhea, nausea and vomiting.  Endocrine: Negative.   Genitourinary: Negative.  Negative for difficulty urinating, dysuria and urgency.  Musculoskeletal: Negative.  Negative for arthralgias, back pain, myalgias and neck pain.  Skin: Negative.  Negative for color change, pallor and rash.  Neurological: Negative.  Negative for dizziness, weakness, light-headedness and headaches.  Hematological: Negative for adenopathy. Does not bruise/bleed easily.  Psychiatric/Behavioral: Negative.     Objective:  BP 136/86 (BP Location: Right Arm, Patient Position: Sitting, Cuff Size: Normal)   Pulse 78   Temp 97.7 F (36.5 C) (Oral)   Resp  16   Ht 5\' 1"  (1.549 m)   Wt 189 lb (85.7 kg)   LMP 01/27/2009   SpO2 95%   BMI 35.71 kg/m   Physical Exam Vitals signs reviewed.  Constitutional:      Appearance: She is obese. She is not ill-appearing or diaphoretic.  HENT:     Nose: No congestion or rhinorrhea.     Mouth/Throat:     Mouth: Mucous membranes are moist.     Pharynx: Oropharynx is clear. No oropharyngeal exudate or  posterior oropharyngeal erythema.  Eyes:     General: No scleral icterus.    Conjunctiva/sclera: Conjunctivae normal.  Neck:     Musculoskeletal: Normal range of motion and neck supple. No neck rigidity.  Cardiovascular:     Rate and Rhythm: Normal rate and regular rhythm.     Heart sounds: No murmur. No gallop.   Pulmonary:     Breath sounds: No stridor. No wheezing, rhonchi or rales.  Abdominal:     General: Abdomen is flat. Bowel sounds are normal.     Palpations: There is no hepatomegaly, splenomegaly or mass.     Tenderness: There is no abdominal tenderness.  Musculoskeletal: Normal range of motion.        General: No swelling.     Right lower leg: No edema.     Left lower leg: No edema.  Lymphadenopathy:     Cervical: No cervical adenopathy.  Skin:    General: Skin is warm and dry.     Coloration: Skin is not pale.     Findings: No erythema or rash.  Neurological:     General: No focal deficit present.     Mental Status: She is oriented to person, place, and time. Mental status is at baseline.  Psychiatric:        Mood and Affect: Mood normal.        Behavior: Behavior normal.        Thought Content: Thought content normal.        Judgment: Judgment normal.     Lab Results  Component Value Date   WBC 5.6 04/08/2018   HGB 13.3 04/08/2018   HCT 39.5 04/08/2018   PLT 220.0 04/08/2018   GLUCOSE 94 04/08/2018   CHOL 170 04/08/2018   TRIG 195.0 (H) 04/08/2018   HDL 74.40 04/08/2018   LDLDIRECT 142.0 04/06/2017   LDLCALC 57 04/08/2018   ALT 25 04/08/2018   AST 27 04/08/2018   NA 141 04/08/2018   K 4.0 04/08/2018   CL 107 04/08/2018   CREATININE 0.70 04/08/2018   BUN 16 04/08/2018   CO2 25 04/08/2018   TSH 1.32 04/08/2018   HGBA1C 6.2 04/08/2018    Assessment & Plan:   Monica Barnes was seen today for annual exam.  Diagnoses and all orders for this visit:  NASH (nonalcoholic steatohepatitis)- Her LFTs are normal now. -     Gamma GT; Future -     Hepatic  function panel; Future  Prediabetes- Her A1c is at 6.2%.  Medical therapy is not indicated. -     Basic metabolic panel; Future -     Hemoglobin A1c; Future  Pure hyperglyceridemia- She has achieved her LDL goal and is doing well on the statin. -     Lipid panel; Future  Routine general medical examination at a health care facility- Exam completed, labs reviewed, she refused a flu vaccine; Pap, mammogram, colon cancer screening are all up-to-date.  Patient education material was given.  Hyperlipidemia LDL goal <130- She has achieved her LDL goal and is doing well on the statin. -     CBC with Differential/Platelet; Future -     Lipid panel; Future -     TSH; Future  Encounter for screening for HIV -     HIV Antibody (routine testing w rflx); Future   I have discontinued Monica Barnes's omeprazole. I am also having her maintain her Carbonyl Iron (PERFECT IRON PO), ALPRAZolam, B Complex-C-Folic Acid (STRESS B COMPLEX PO), FLUoxetine, dexlansoprazole, budesonide-formoterol, omega-3 acid ethyl esters, and rosuvastatin.  No orders of the defined types were placed in this encounter.    Follow-up: Return in about 6 months (around 10/09/2018).  Monica Calico, MD

## 2018-04-09 LAB — HIV ANTIBODY (ROUTINE TESTING W REFLEX): HIV 1&2 Ab, 4th Generation: NONREACTIVE

## 2018-04-25 ENCOUNTER — Other Ambulatory Visit: Payer: Self-pay | Admitting: Internal Medicine

## 2018-04-25 DIAGNOSIS — E781 Pure hyperglyceridemia: Secondary | ICD-10-CM

## 2018-04-29 LAB — HM MAMMOGRAPHY: HM Mammogram: NORMAL (ref 0–4)

## 2018-05-01 ENCOUNTER — Other Ambulatory Visit: Payer: Self-pay | Admitting: Internal Medicine

## 2018-05-01 DIAGNOSIS — E785 Hyperlipidemia, unspecified: Secondary | ICD-10-CM

## 2018-05-24 ENCOUNTER — Telehealth: Payer: Self-pay

## 2018-05-24 NOTE — Telephone Encounter (Signed)
Pt contacted and informed of approval.

## 2018-05-24 NOTE — Telephone Encounter (Signed)
Received approval letter for PAP for rx of Dexilant 60 mg capsules

## 2018-11-03 ENCOUNTER — Other Ambulatory Visit: Payer: Self-pay | Admitting: Internal Medicine

## 2018-11-03 DIAGNOSIS — E781 Pure hyperglyceridemia: Secondary | ICD-10-CM

## 2018-11-03 DIAGNOSIS — E785 Hyperlipidemia, unspecified: Secondary | ICD-10-CM

## 2019-04-11 ENCOUNTER — Encounter: Payer: Self-pay | Admitting: Internal Medicine

## 2019-04-11 ENCOUNTER — Other Ambulatory Visit: Payer: Self-pay

## 2019-04-11 ENCOUNTER — Ambulatory Visit (INDEPENDENT_AMBULATORY_CARE_PROVIDER_SITE_OTHER): Payer: Medicare HMO | Admitting: Internal Medicine

## 2019-04-11 VITALS — BP 124/74 | HR 77 | Temp 97.7°F | Resp 16 | Ht 61.0 in | Wt 163.2 lb

## 2019-04-11 DIAGNOSIS — E785 Hyperlipidemia, unspecified: Secondary | ICD-10-CM

## 2019-04-11 DIAGNOSIS — R7303 Prediabetes: Secondary | ICD-10-CM

## 2019-04-11 DIAGNOSIS — Z0000 Encounter for general adult medical examination without abnormal findings: Secondary | ICD-10-CM | POA: Diagnosis not present

## 2019-04-11 DIAGNOSIS — E781 Pure hyperglyceridemia: Secondary | ICD-10-CM

## 2019-04-11 DIAGNOSIS — K7581 Nonalcoholic steatohepatitis (NASH): Secondary | ICD-10-CM | POA: Diagnosis not present

## 2019-04-11 DIAGNOSIS — Z23 Encounter for immunization: Secondary | ICD-10-CM

## 2019-04-11 DIAGNOSIS — K219 Gastro-esophageal reflux disease without esophagitis: Secondary | ICD-10-CM | POA: Diagnosis not present

## 2019-04-11 LAB — LIPID PANEL
Cholesterol: 158 mg/dL (ref 0–200)
HDL: 60.2 mg/dL (ref >39.00)
LDL Cholesterol: 66 mg/dL (ref 0–99)
NonHDL: 98.24
Total CHOL/HDL Ratio: 3
Triglycerides: 159 mg/dL — ABNORMAL HIGH (ref 0.0–149.0)
VLDL: 31.8 mg/dL (ref 0.0–40.0)

## 2019-04-11 LAB — CBC WITH DIFFERENTIAL/PLATELET
Basophils Absolute: 0 10*3/uL (ref 0.0–0.1)
Basophils Relative: 0.1 % (ref 0.0–3.0)
Eosinophils Absolute: 0.1 10*3/uL (ref 0.0–0.7)
Eosinophils Relative: 0.9 % (ref 0.0–5.0)
HCT: 42.7 % (ref 36.0–46.0)
Hemoglobin: 14.3 g/dL (ref 12.0–15.0)
Lymphocytes Relative: 18.2 % (ref 12.0–46.0)
Lymphs Abs: 1.5 10*3/uL (ref 0.7–4.0)
MCHC: 33.5 g/dL (ref 30.0–36.0)
MCV: 89.7 fl (ref 78.0–100.0)
Monocytes Absolute: 0.3 10*3/uL (ref 0.1–1.0)
Monocytes Relative: 4 % (ref 3.0–12.0)
Neutro Abs: 6.3 10*3/uL (ref 1.4–7.7)
Neutrophils Relative %: 76.8 % (ref 43.0–77.0)
Platelets: 183 10*3/uL (ref 150.0–400.0)
RBC: 4.76 Mil/uL (ref 3.87–5.11)
RDW: 13.9 % (ref 11.5–15.5)
WBC: 8.2 10*3/uL (ref 4.0–10.5)

## 2019-04-11 LAB — TSH: TSH: 1.84 u[IU]/mL (ref 0.35–4.50)

## 2019-04-11 LAB — BASIC METABOLIC PANEL
BUN: 19 mg/dL (ref 6–23)
CO2: 26 mEq/L (ref 19–32)
Calcium: 10 mg/dL (ref 8.4–10.5)
Chloride: 104 mEq/L (ref 96–112)
Creatinine, Ser: 0.74 mg/dL (ref 0.40–1.20)
GFR: 78.72 mL/min (ref >60.00)
Glucose, Bld: 120 mg/dL — ABNORMAL HIGH (ref 70–99)
Potassium: 4.9 mEq/L (ref 3.5–5.1)
Sodium: 139 mEq/L (ref 135–145)

## 2019-04-11 LAB — HEPATIC FUNCTION PANEL
ALT: 25 U/L (ref 0–35)
AST: 23 U/L (ref 0–37)
Albumin: 4.7 g/dL (ref 3.5–5.2)
Alkaline Phosphatase: 83 U/L (ref 39–117)
Bilirubin, Direct: 0 mg/dL (ref 0.0–0.3)
Total Bilirubin: 0.3 mg/dL (ref 0.2–1.2)
Total Protein: 7.4 g/dL (ref 6.0–8.3)

## 2019-04-11 LAB — HEMOGLOBIN A1C: Hgb A1c MFr Bld: 6.3 % (ref 4.6–6.5)

## 2019-04-11 NOTE — Patient Instructions (Signed)
Health Maintenance, Female Adopting a healthy lifestyle and getting preventive care are important in promoting health and wellness. Ask your health care provider about:  The right schedule for you to have regular tests and exams.  Things you can do on your own to prevent diseases and keep yourself healthy. What should I know about diet, weight, and exercise? Eat a healthy diet   Eat a diet that includes plenty of vegetables, fruits, low-fat dairy products, and lean protein.  Do not eat a lot of foods that are high in solid fats, added sugars, or sodium. Maintain a healthy weight Body mass index (BMI) is used to identify weight problems. It estimates body fat based on height and weight. Your health care provider can help determine your BMI and help you achieve or maintain a healthy weight. Get regular exercise Get regular exercise. This is one of the most important things you can do for your health. Most adults should:  Exercise for at least 150 minutes each week. The exercise should increase your heart rate and make you sweat (moderate-intensity exercise).  Do strengthening exercises at least twice a week. This is in addition to the moderate-intensity exercise.  Spend less time sitting. Even light physical activity can be beneficial. Watch cholesterol and blood lipids Have your blood tested for lipids and cholesterol at 65 years of age, then have this test every 5 years. Have your cholesterol levels checked more often if:  Your lipid or cholesterol levels are high.  You are older than 65 years of age.  You are at high risk for heart disease. What should I know about cancer screening? Depending on your health history and family history, you may need to have cancer screening at various ages. This may include screening for:  Breast cancer.  Cervical cancer.  Colorectal cancer.  Skin cancer.  Lung cancer. What should I know about heart disease, diabetes, and high blood  pressure? Blood pressure and heart disease  High blood pressure causes heart disease and increases the risk of stroke. This is more likely to develop in people who have high blood pressure readings, are of African descent, or are overweight.  Have your blood pressure checked: ? Every 3-5 years if you are 18-39 years of age. ? Every year if you are 40 years old or older. Diabetes Have regular diabetes screenings. This checks your fasting blood sugar level. Have the screening done:  Once every three years after age 40 if you are at a normal weight and have a low risk for diabetes.  More often and at a younger age if you are overweight or have a high risk for diabetes. What should I know about preventing infection? Hepatitis B If you have a higher risk for hepatitis B, you should be screened for this virus. Talk with your health care provider to find out if you are at risk for hepatitis B infection. Hepatitis C Testing is recommended for:  Everyone born from 1945 through 1965.  Anyone with known risk factors for hepatitis C. Sexually transmitted infections (STIs)  Get screened for STIs, including gonorrhea and chlamydia, if: ? You are sexually active and are younger than 65 years of age. ? You are older than 65 years of age and your health care provider tells you that you are at risk for this type of infection. ? Your sexual activity has changed since you were last screened, and you are at increased risk for chlamydia or gonorrhea. Ask your health care provider if   you are at risk.  Ask your health care provider about whether you are at high risk for HIV. Your health care provider may recommend a prescription medicine to help prevent HIV infection. If you choose to take medicine to prevent HIV, you should first get tested for HIV. You should then be tested every 3 months for as long as you are taking the medicine. Pregnancy  If you are about to stop having your period (premenopausal) and  you may become pregnant, seek counseling before you get pregnant.  Take 400 to 800 micrograms (mcg) of folic acid every day if you become pregnant.  Ask for birth control (contraception) if you want to prevent pregnancy. Osteoporosis and menopause Osteoporosis is a disease in which the bones lose minerals and strength with aging. This can result in bone fractures. If you are 65 years old or older, or if you are at risk for osteoporosis and fractures, ask your health care provider if you should:  Be screened for bone loss.  Take a calcium or vitamin D supplement to lower your risk of fractures.  Be given hormone replacement therapy (HRT) to treat symptoms of menopause. Follow these instructions at home: Lifestyle  Do not use any products that contain nicotine or tobacco, such as cigarettes, e-cigarettes, and chewing tobacco. If you need help quitting, ask your health care provider.  Do not use street drugs.  Do not share needles.  Ask your health care provider for help if you need support or information about quitting drugs. Alcohol use  Do not drink alcohol if: ? Your health care provider tells you not to drink. ? You are pregnant, may be pregnant, or are planning to become pregnant.  If you drink alcohol: ? Limit how much you use to 0-1 drink a day. ? Limit intake if you are breastfeeding.  Be aware of how much alcohol is in your drink. In the [place] one drink equals one 12 oz bottle of beer (355 mL), one 5 oz glass of wine (148 mL), or one 1 oz glass of hard liquor (44 mL). General instructions  Schedule regular health, dental, and eye exams.  Stay current with your vaccines.  Tell your health care provider if: ? You often feel depressed. ? You have ever been abused or do not feel safe at home. Summary  Adopting a healthy lifestyle and getting preventive care are important in promoting health and wellness.  Follow your health care provider's instructions about healthy  diet, exercising, and getting tested or screened for diseases.  Follow your health care provider's instructions on monitoring your cholesterol and blood pressure. This information is not intended to replace advice given to you by your health care provider. Make sure you discuss any questions you have with your health care provider. Document Revised: 01/06/2018 Document Reviewed: 01/06/2018 Elsevier Patient Education  2020 [employer]  

## 2019-04-11 NOTE — Progress Notes (Signed)
Subjective:  Patient ID: Monica Barnes, female    DOB: 04-17-1955  Age: 64 y.o. MRN: [id number]  CC: Annual Exam  This visit occurred during the SARS-CoV-2 public health emergency.  Safety protocols were in place, including screening questions prior to the visit, additional usage of staff PPE, and extensive cleaning of exam room while observing appropriate contact time as indicated for disinfecting solutions.    HPI Charde Macfarlane presents for a CPX.  She underwent a tummy tuck up and liposuction about 3 or 4 months ago.  She is intentionally losing weight with lifestyle modifications (noom).  She is active and denies any recent episodes of chest pain or shortness of breath  Outpatient Medications Prior to Visit  Medication Sig Dispense Refill  . ALPRAZolam (XANAX) 0.5 MG tablet Take 0.5 mg by mouth 3 (three) times daily as needed.      . Ascorbic Acid (VITAMIN C) 1000 MG tablet Take 1,000 mg by mouth daily.    . B Complex-C-Folic Acid (STRESS B COMPLEX PO) Once a day     . budesonide-formoterol (SYMBICORT) 80-4.5 MCG/ACT inhaler Inhale 2 puffs into the lungs 2 (two) times daily. 3 Inhaler 4  . Carbonyl Iron (PERFECT IRON PO) Once a day     . cholecalciferol (VITAMIN D3) 25 MCG (1000 UNIT) tablet Take 1,000 Units by mouth daily.    Marland Kitchen dexlansoprazole (DEXILANT) 60 MG capsule Take 60 mg by mouth daily.    . ferrous sulfate 325 (65 FE) MG tablet Take 325 mg by mouth daily with breakfast.    . FLUoxetine (PROZAC) 20 MG capsule Take 20 mg by mouth daily.      Marland Kitchen omega-3 acid ethyl esters (LOVAZA) 1 g capsule TAKE 2 CAPSULES (2 G TOTAL) BY MOUTH 2 (TWO) TIMES DAILY. 360 capsule 1  . rosuvastatin (CRESTOR) 10 MG tablet TAKE 1 TABLET BY MOUTH EVERY DAY 90 tablet 1  . TURMERIC PO Take by mouth.    . Vitamin D-Vitamin K (VITAMIN K2-VITAMIN D3 PO) Take by mouth.    . vitamin E 180 MG (400 UNITS) capsule Take 400 Units by mouth daily.    Marland Kitchen zinc gluconate 50 MG tablet Take 50 mg by mouth daily.      No facility-administered medications prior to visit.    ROS Review of Systems  Constitutional: Negative for diaphoresis, fatigue and unexpected weight change.  HENT: Negative.   Eyes: Negative.   Respiratory: Negative for cough, chest tightness, shortness of breath and wheezing.   Cardiovascular: Negative for chest pain, palpitations and leg swelling.  Gastrointestinal: Negative for abdominal pain, constipation, diarrhea, nausea and vomiting.  Endocrine: Negative.   Genitourinary: Negative.  Negative for difficulty urinating.  Musculoskeletal: Negative for arthralgias, joint swelling and myalgias.  Skin: Negative.  Negative for color change.  Neurological: Negative.  Negative for dizziness, weakness and light-headedness.  Hematological: Negative for adenopathy. Does not bruise/bleed easily.  Psychiatric/Behavioral: Negative.     Objective:  BP 124/74 (BP Location: Left Arm, Patient Position: Sitting, Cuff Size: Large)   Pulse 77   Temp 97.7 F (36.5 C) (Oral)   Resp 16   Ht 5' 1"  (1.549 m)   Wt 163 lb 4 oz (74 kg)   LMP 01/27/2009   SpO2 97%   BMI 30.85 kg/m   BP Readings from Last 3 Encounters:  04/11/19 124/74  04/08/18 136/86  04/06/17 132/80    Wt Readings from Last 3 Encounters:  04/11/19 163 lb 4 oz (74 kg)  04/08/18  189 lb (85.7 kg)  04/06/17 182 lb (82.6 kg)    Physical Exam Vitals reviewed.  Constitutional:      Appearance: Normal appearance.  HENT:     Nose: Nose normal.     Mouth/Throat:     Mouth: Mucous membranes are moist.  Eyes:     General: No scleral icterus.    Conjunctiva/sclera: Conjunctivae normal.  Cardiovascular:     Rate and Rhythm: Normal rate and regular rhythm.     Heart sounds: No murmur.  Pulmonary:     Effort: Pulmonary effort is normal.     Breath sounds: No stridor. No wheezing, rhonchi or rales.  Abdominal:     General: Abdomen is flat. Bowel sounds are normal. There is no distension.     Palpations: Abdomen is soft.  There is no hepatomegaly, splenomegaly or mass.     Tenderness: There is no abdominal tenderness.  Musculoskeletal:        General: Normal range of motion.     Cervical back: Neck supple.     Right lower leg: No edema.     Left lower leg: No edema.  Lymphadenopathy:     Cervical: No cervical adenopathy.  Skin:    General: Skin is warm and dry.     Coloration: Skin is not pale.  Neurological:     General: No focal deficit present.     Mental Status: She is alert.  Psychiatric:        Mood and Affect: Mood normal.        Behavior: Behavior normal.     Lab Results  Component Value Date   WBC 8.2 04/11/2019   HGB 14.3 04/11/2019   HCT 42.7 04/11/2019   PLT 183.0 04/11/2019   GLUCOSE 120 (H) 04/11/2019   CHOL 158 04/11/2019   TRIG 159.0 (H) 04/11/2019   HDL 60.20 04/11/2019   LDLDIRECT 142.0 04/06/2017   LDLCALC 66 04/11/2019   ALT 25 04/11/2019   AST 23 04/11/2019   NA 139 04/11/2019   K 4.9 04/11/2019   CL 104 04/11/2019   CREATININE 0.74 04/11/2019   BUN 19 04/11/2019   CO2 26 04/11/2019   TSH 1.84 04/11/2019   HGBA1C 6.3 04/11/2019    MM DIAG BREAST W/IMPLANT TOMO BILATERAL  Result Date: 02/25/2016 CLINICAL DATA:  Intermittent tenderness to palpation in the axillary tail regions of both breasts, radiating to the axillae, for the past 3 months. EXAM: 2D DIGITAL DIAGNOSTIC BILATERAL MAMMOGRAM WITH IMPLANTS, CAD AND ADJUNCT TOMO The patient has retroglandular implants. Standard and implant displaced views were performed. COMPARISON:  Previous exam(s). ACR Breast Density Category c: The breast tissue is heterogeneously dense, which may obscure small masses. FINDINGS: 2D and 3D tomographic images of the breasts demonstrate bilateral extracapsular silicone. There are no interval findings suspicious for malignancy. Mammographic images were processed with CAD. IMPRESSION: Bilateral extracapsular silicone breast implant rupture. No evidence of malignancy. RECOMMENDATION:  Bilateral screening mammogram in 1 year. I have discussed the findings and recommendations with the patient. Results were also provided in writing at the conclusion of the visit. If applicable, a reminder letter will be sent to the patient regarding the next appointment. BI-RADS CATEGORY  2: Benign. Electronically Signed   By: Claudie Revering M.D.   On: 02/25/2016 13:35    Assessment & Plan:   Arilla was seen today for annual exam.  Diagnoses and all orders for this visit:  NASH (nonalcoholic steatohepatitis)- Her LFTs are normal now -  Hepatic function panel  GERD without esophagitis- Her symptoms are well controlled with the PPI. -     CBC with Differential/Platelet  Hyperlipidemia LDL goal <130- She has achieved her LDL goal and is doing well on the statin -     Lipid panel -     TSH  Prediabetes- Her A1c is at 6.3%.  Medical therapy is not indicated. -     Basic metabolic panel -     Hemoglobin A1c  Pure hyperglyceridemia- Her triglycerides are normal now -     Lipid panel  Routine general medical examination at a health care facility- Exam completed, labs reviewed, she refused to get a flu vaccine but agreed to get a pneumovaccine, cancer screenings are all up-to-date, patient education was given.  Need for pneumococcal vaccination -     Pneumococcal conjugate vaccine 13-valent   I am having Monica Barnes "Debbie" maintain her Carbonyl Iron (PERFECT IRON PO), ALPRAZolam, B Complex-C-Folic Acid (STRESS B COMPLEX PO), FLUoxetine, dexlansoprazole, budesonide-formoterol, omega-3 acid ethyl esters, rosuvastatin, zinc gluconate, vitamin E, Vitamin D-Vitamin K (VITAMIN K2-VITAMIN D3 PO), TURMERIC PO, vitamin C, cholecalciferol, and ferrous sulfate.  No orders of the defined types were placed in this encounter.    Follow-up: Return in about 6 months (around 10/12/2019).  [name], MD

## 2019-05-04 ENCOUNTER — Other Ambulatory Visit: Payer: Self-pay | Admitting: Internal Medicine

## 2019-05-04 DIAGNOSIS — E785 Hyperlipidemia, unspecified: Secondary | ICD-10-CM

## 2019-05-04 DIAGNOSIS — E781 Pure hyperglyceridemia: Secondary | ICD-10-CM

## 2019-05-06 ENCOUNTER — Telehealth: Payer: Self-pay

## 2019-05-06 NOTE — Telephone Encounter (Signed)
Key: [id number]

## 2019-06-17 ENCOUNTER — Telehealth: Payer: Self-pay

## 2019-06-17 NOTE — Telephone Encounter (Signed)
Are you working on this?   If not I can.

## 2019-06-17 NOTE — Telephone Encounter (Signed)
New message    The patient medication free dexlansoprazole (DEXILANT) 60 MG capsule      [employer] company name   [phone]   Needs Dr. Lucianne Lei License # to be verify.

## 2019-06-17 NOTE — Telephone Encounter (Signed)
No I haven't done any medication assistance for this patient

## 2019-06-20 NOTE — Telephone Encounter (Signed)
[employer] contacted and I have informed them of PCP license number.

## 2019-11-02 ENCOUNTER — Other Ambulatory Visit: Payer: Self-pay | Admitting: Internal Medicine

## 2019-11-02 DIAGNOSIS — E785 Hyperlipidemia, unspecified: Secondary | ICD-10-CM

## 2019-11-02 DIAGNOSIS — E781 Pure hyperglyceridemia: Secondary | ICD-10-CM

## 2020-02-15 ENCOUNTER — Other Ambulatory Visit: Payer: Self-pay | Admitting: Internal Medicine

## 2020-02-15 NOTE — Telephone Encounter (Signed)
Monica Barnes w/ [employer] patient assistance program called and said that they needed a new prescription for dexlansoprazole (DEXILANT) 60 MG capsule  She said it can be faxed or e scribed.    Fax: [phone] Phone:  [phone]

## 2020-02-16 MED ORDER — DEXLANSOPRAZOLE 60 MG PO CPDR: 60.0000 mg | DELAYED_RELEASE_CAPSULE | Freq: Every day | ORAL | 1 refills | Status: DC

## 2020-02-16 NOTE — Telephone Encounter (Signed)
Rx faxed to fax number listed previously.

## 2020-02-20 ENCOUNTER — Encounter: Payer: Self-pay | Admitting: Internal Medicine

## 2020-04-17 ENCOUNTER — Encounter: Payer: Medicare HMO | Admitting: Internal Medicine

## 2020-04-17 ENCOUNTER — Ambulatory Visit: Payer: Medicare HMO

## 2020-05-01 ENCOUNTER — Encounter: Payer: Self-pay | Admitting: Internal Medicine

## 2020-05-01 ENCOUNTER — Ambulatory Visit (INDEPENDENT_AMBULATORY_CARE_PROVIDER_SITE_OTHER): Payer: Medicare HMO | Admitting: Internal Medicine

## 2020-05-01 ENCOUNTER — Other Ambulatory Visit: Payer: Self-pay | Admitting: Internal Medicine

## 2020-05-01 ENCOUNTER — Ambulatory Visit (INDEPENDENT_AMBULATORY_CARE_PROVIDER_SITE_OTHER): Payer: Medicare HMO

## 2020-05-01 ENCOUNTER — Other Ambulatory Visit: Payer: Self-pay

## 2020-05-01 VITALS — BP 128/84 | HR 80 | Temp 98.1°F | Resp 16 | Ht 61.0 in | Wt 182.2 lb

## 2020-05-01 VITALS — BP 162/78 | HR 80 | Temp 98.1°F | Ht 61.0 in | Wt 182.3 lb

## 2020-05-01 DIAGNOSIS — Z0000 Encounter for general adult medical examination without abnormal findings: Secondary | ICD-10-CM

## 2020-05-01 DIAGNOSIS — J4520 Mild intermittent asthma, uncomplicated: Secondary | ICD-10-CM | POA: Diagnosis not present

## 2020-05-01 DIAGNOSIS — R7303 Prediabetes: Secondary | ICD-10-CM

## 2020-05-01 DIAGNOSIS — E785 Hyperlipidemia, unspecified: Secondary | ICD-10-CM

## 2020-05-01 DIAGNOSIS — K7581 Nonalcoholic steatohepatitis (NASH): Secondary | ICD-10-CM | POA: Diagnosis not present

## 2020-05-01 DIAGNOSIS — E781 Pure hyperglyceridemia: Secondary | ICD-10-CM

## 2020-05-01 DIAGNOSIS — Z1211 Encounter for screening for malignant neoplasm of colon: Secondary | ICD-10-CM | POA: Insufficient documentation

## 2020-05-01 LAB — LIPID PANEL
Cholesterol: 188 mg/dL (ref 0–200)
HDL: 74.5 mg/dL (ref >39.00)
NonHDL: 113.12
Total CHOL/HDL Ratio: 3
Triglycerides: 290 mg/dL — ABNORMAL HIGH (ref 0.0–149.0)
VLDL: 58 mg/dL — ABNORMAL HIGH (ref 0.0–40.0)

## 2020-05-01 LAB — PROTIME-INR
INR: 1 ratio (ref 0.8–1.0)
Prothrombin Time: 11.1 s (ref 9.6–13.1)

## 2020-05-01 LAB — BASIC METABOLIC PANEL
BUN: 19 mg/dL (ref 6–23)
CO2: 28 mEq/L (ref 19–32)
Calcium: 10.1 mg/dL (ref 8.4–10.5)
Chloride: 103 mEq/L (ref 96–112)
Creatinine, Ser: 0.98 mg/dL (ref 0.40–1.20)
GFR: 60.26 mL/min (ref >60.00)
Glucose, Bld: 101 mg/dL — ABNORMAL HIGH (ref 70–99)
Potassium: 4.2 mEq/L (ref 3.5–5.1)
Sodium: 140 mEq/L (ref 135–145)

## 2020-05-01 LAB — HEPATIC FUNCTION PANEL
ALT: 27 U/L (ref 0–35)
AST: 26 U/L (ref 0–37)
Albumin: 4.8 g/dL (ref 3.5–5.2)
Alkaline Phosphatase: 67 U/L (ref 39–117)
Bilirubin, Direct: 0.1 mg/dL (ref 0.0–0.3)
Total Bilirubin: 0.3 mg/dL (ref 0.2–1.2)
Total Protein: 7 g/dL (ref 6.0–8.3)

## 2020-05-01 LAB — CBC WITH DIFFERENTIAL/PLATELET
Basophils Absolute: 0 10*3/uL (ref 0.0–0.1)
Basophils Relative: 0.3 % (ref 0.0–3.0)
Eosinophils Absolute: 0.1 10*3/uL (ref 0.0–0.7)
Eosinophils Relative: 1.6 % (ref 0.0–5.0)
HCT: 39.5 % (ref 36.0–46.0)
Hemoglobin: 13.6 g/dL (ref 12.0–15.0)
Lymphocytes Relative: 24 % (ref 12.0–46.0)
Lymphs Abs: 1.6 10*3/uL (ref 0.7–4.0)
MCHC: 34.4 g/dL (ref 30.0–36.0)
MCV: 92.2 fl (ref 78.0–100.0)
Monocytes Absolute: 0.5 10*3/uL (ref 0.1–1.0)
Monocytes Relative: 6.7 % (ref 3.0–12.0)
Neutro Abs: 4.6 10*3/uL (ref 1.4–7.7)
Neutrophils Relative %: 67.4 % (ref 43.0–77.0)
Platelets: 188 10*3/uL (ref 150.0–400.0)
RBC: 4.29 Mil/uL (ref 3.87–5.11)
RDW: 13.9 % (ref 11.5–15.5)
WBC: 6.8 10*3/uL (ref 4.0–10.5)

## 2020-05-01 LAB — HEMOGLOBIN A1C: Hgb A1c MFr Bld: 6.2 % (ref 4.6–6.5)

## 2020-05-01 LAB — TSH: TSH: 1.71 u[IU]/mL (ref 0.35–4.50)

## 2020-05-01 LAB — LDL CHOLESTEROL, DIRECT: Direct LDL: 91 mg/dL

## 2020-05-01 MED ORDER — BUDESONIDE-FORMOTEROL FUMARATE 80-4.5 MCG/ACT IN AERO: 2.0000 | INHALATION_SPRAY | Freq: Two times a day (BID) | RESPIRATORY_TRACT | 1 refills | Status: DC

## 2020-05-01 MED ORDER — ROSUVASTATIN CALCIUM 10 MG PO TABS: 10.0000 mg | ORAL_TABLET | Freq: Every day | ORAL | 1 refills | Status: DC

## 2020-05-01 MED ORDER — OMEGA-3-ACID ETHYL ESTERS 1 G PO CAPS: 2.0000 g | ORAL_CAPSULE | Freq: Two times a day (BID) | ORAL | 1 refills | Status: DC

## 2020-05-01 NOTE — Patient Instructions (Signed)
Monica Barnes , Thank you for taking time to come for your Medicare Wellness Visit. I appreciate your ongoing commitment to your health goals. Please review the following plan we discussed and let me know if I can assist you in the future.   Screening recommendations/referrals: Colonoscopy: 04/21/2016; Cologuard due every 3  years Mammogram: scheduled for 05/16/2020 Bone Density: scheduled for 05/16/2020 Recommended yearly ophthalmology/optometry visit for glaucoma screening and checkup Recommended yearly dental visit for hygiene and checkup  Vaccinations: Influenza vaccine: declined Pneumococcal vaccine: 04/06/2017, 04/11/2019 Tdap vaccine: 10/22/2015; due every 10 years Shingles vaccine: never done   Covid-19: declined  Advanced directives: Please bring a copy of your health care power of attorney and living will to the office at your convenience.  Conditions/risks identified: Yes; Reviewed health maintenance screenings with patient today and relevant education, vaccines, and/or referrals were provided. Please continue to do your personal lifestyle choices by: daily care of teeth and gums, regular physical activity (goal should be 5 days a week for 30 minutes), eat a healthy diet, avoid tobacco and drug use, limiting any alcohol intake, taking a low-dose aspirin (if not allergic or have been advised by your provider otherwise) and taking vitamins and minerals as recommended by your provider. Continue doing brain stimulating activities (puzzles, reading, adult coloring books, staying active) to keep memory sharp. Continue to eat heart healthy diet (full of fruits, vegetables, whole grains, lean protein, water--limit salt, fat, and sugar intake) and increase physical activity as tolerated.  Next appointment: Please schedule your next Medicare Wellness Visit with your Nurse Health Advisor in 1 year by calling [phone].  Preventive Care 75 Years and Older, Female Preventive care refers to lifestyle  choices and visits with your health care provider that can promote health and wellness. What does preventive care include?  A yearly physical exam. This is also called an annual well check.  Dental exams once or twice a year.  Routine eye exams. Ask your health care provider how often you should have your eyes checked.  Personal lifestyle choices, including:  Daily care of your teeth and gums.  Regular physical activity.  Eating a healthy diet.  Avoiding tobacco and drug use.  Limiting alcohol use.  Practicing safe sex.  Taking low-dose aspirin every day.  Taking vitamin and mineral supplements as recommended by your health care provider. What happens during an annual well check? The services and screenings done by your health care provider during your annual well check will depend on your age, overall health, lifestyle risk factors, and family history of disease. Counseling  Your health care provider may ask you questions about your:  Alcohol use.  Tobacco use.  Drug use.  Emotional well-being.  Home and relationship well-being.  Sexual activity.  Eating habits.  History of falls.  Memory and ability to understand (cognition).  Work and work Statistician.  Reproductive health. Screening  You may have the following tests or measurements:  Height, weight, and BMI.  Blood pressure.  Lipid and cholesterol levels. These may be checked every 5 years, or more frequently if you are over 71 years old.  Skin check.  Lung cancer screening. You may have this screening every year starting at age 63 if you have a 30-pack-year history of smoking and currently smoke or have quit within the past 15 years.  Fecal occult blood test (FOBT) of the stool. You may have this test every year starting at age 27.  Flexible sigmoidoscopy or colonoscopy. You may have a sigmoidoscopy  every 5 years or a colonoscopy every 10 years starting at age 5.  Hepatitis C blood  test.  Hepatitis B blood test.  Sexually transmitted disease (STD) testing.  Diabetes screening. This is done by checking your blood sugar (glucose) after you have not eaten for a while (fasting). You may have this done every 1-3 years.  Bone density scan. This is done to screen for osteoporosis. You may have this done starting at age 55.  Mammogram. This may be done every 1-2 years. Talk to your health care provider about how often you should have regular mammograms. Talk with your health care provider about your test results, treatment options, and if necessary, the need for more tests. Vaccines  Your health care provider may recommend certain vaccines, such as:  Influenza vaccine. This is recommended every year.  Tetanus, diphtheria, and acellular pertussis (Tdap, Td) vaccine. You may need a Td booster every 10 years.  Zoster vaccine. You may need this after age 72.  Pneumococcal 13-valent conjugate (PCV13) vaccine. One dose is recommended after age 4.  Pneumococcal polysaccharide (PPSV23) vaccine. One dose is recommended after age 57. Talk to your health care provider about which screenings and vaccines you need and how often you need them. This information is not intended to replace advice given to you by your health care provider. Make sure you discuss any questions you have with your health care provider. Document Released: 02/09/2015 Document Revised: 10/03/2015 Document Reviewed: 11/14/2014 Elsevier Interactive Patient Education  2017 [place] Prevention in the Home Falls can cause injuries. They can happen to people of all ages. There are many things you can do to make your home safe and to help prevent falls. What can I do on the outside of my home?  Regularly fix the edges of walkways and driveways and fix any cracks.  Remove anything that might make you trip as you walk through a door, such as a raised step or threshold.  Trim any bushes or trees on the  path to your home.  Use bright outdoor lighting.  Clear any walking paths of anything that might make someone trip, such as rocks or tools.  Regularly check to see if handrails are loose or broken. Make sure that both sides of any steps have handrails.  Any raised decks and porches should have guardrails on the edges.  Have any leaves, snow, or ice cleared regularly.  Use sand or salt on walking paths during winter.  Clean up any spills in your garage right away. This includes oil or grease spills. What can I do in the bathroom?  Use night lights.  Install grab bars by the toilet and in the tub and shower. Do not use towel bars as grab bars.  Use non-skid mats or decals in the tub or shower.  If you need to sit down in the shower, use a plastic, non-slip stool.  Keep the floor dry. Clean up any water that spills on the floor as soon as it happens.  Remove soap buildup in the tub or shower regularly.  Attach bath mats securely with double-sided non-slip rug tape.  Do not have throw rugs and other things on the floor that can make you trip. What can I do in the bedroom?  Use night lights.  Make sure that you have a light by your bed that is easy to reach.  Do not use any sheets or blankets that are too big for your bed. They should  not hang down onto the floor.  Have a firm chair that has side arms. You can use this for support while you get dressed.  Do not have throw rugs and other things on the floor that can make you trip. What can I do in the kitchen?  Clean up any spills right away.  Avoid walking on wet floors.  Keep items that you use a lot in easy-to-reach places.  If you need to reach something above you, use a strong step stool that has a grab bar.  Keep electrical cords out of the way.  Do not use floor polish or wax that makes floors slippery. If you must use wax, use non-skid floor wax.  Do not have throw rugs and other things on the floor that can  make you trip. What can I do with my stairs?  Do not leave any items on the stairs.  Make sure that there are handrails on both sides of the stairs and use them. Fix handrails that are broken or loose. Make sure that handrails are as long as the stairways.  Check any carpeting to make sure that it is firmly attached to the stairs. Fix any carpet that is loose or worn.  Avoid having throw rugs at the top or bottom of the stairs. If you do have throw rugs, attach them to the floor with carpet tape.  Make sure that you have a light switch at the top of the stairs and the bottom of the stairs. If you do not have them, ask someone to add them for you. What else can I do to help prevent falls?  Wear shoes that:  Do not have high heels.  Have rubber bottoms.  Are comfortable and fit you well.  Are closed at the toe. Do not wear sandals.  If you use a stepladder:  Make sure that it is fully opened. Do not climb a closed stepladder.  Make sure that both sides of the stepladder are locked into place.  Ask someone to hold it for you, if possible.  Clearly mark and make sure that you can see:  Any grab bars or handrails.  First and last steps.  Where the edge of each step is.  Use tools that help you move around (mobility aids) if they are needed. These include:  Canes.  Walkers.  Scooters.  Crutches.  Turn on the lights when you go into a dark area. Replace any light bulbs as soon as they burn out.  Set up your furniture so you have a clear path. Avoid moving your furniture around.  If any of your floors are uneven, fix them.  If there are any pets around you, be aware of where they are.  Review your medicines with your doctor. Some medicines can make you feel dizzy. This can increase your chance of falling. Ask your doctor what other things that you can do to help prevent falls. This information is not intended to replace advice given to you by your health care  provider. Make sure you discuss any questions you have with your health care provider. Document Released: 11/09/2008 Document Revised: 06/21/2015 Document Reviewed: 02/17/2014 Elsevier Interactive Patient Education  2017 [employer].

## 2020-05-01 NOTE — Progress Notes (Signed)
Subjective:  Patient ID: Monica Barnes, female    DOB: 1956/01/19  Age: 65 y.o. MRN: [id number]  CC: Annual Exam and Hyperlipidemia  This visit occurred during the SARS-CoV-2 public health emergency.  Safety protocols were in place, including screening questions prior to the visit, additional usage of staff PPE, and extensive cleaning of exam room while observing appropriate contact time as indicated for disinfecting solutions.    HPI Monica Barnes presents for a CPX and f/up -   She is active and denies any recent episodes of chest pain, shortness of breath, palpitations, edema, or fatigue.  She complains of weight gain and rare wheezing.  Outpatient Medications Prior to Visit  Medication Sig Dispense Refill  . ALPRAZolam (XANAX) 0.5 MG tablet Take 0.5 mg by mouth 3 (three) times daily as needed.    . Ascorbic Acid (VITAMIN C) 1000 MG tablet Take 1,000 mg by mouth daily.    . B Complex-C-Folic Acid (STRESS B COMPLEX PO) Once a day    . Carbonyl Iron (PERFECT IRON PO) Once a day    . cholecalciferol (VITAMIN D3) 25 MCG (1000 UNIT) tablet Take 1,000 Units by mouth daily.    Marland Kitchen dexlansoprazole (DEXILANT) 60 MG capsule Take 1 capsule (60 mg total) by mouth daily. 90 capsule 1  . ferrous sulfate 325 (65 FE) MG tablet Take 325 mg by mouth daily with breakfast.    . FLUoxetine (PROZAC) 20 MG capsule Take 20 mg by mouth daily.    . TURMERIC PO Take by mouth.    . Vitamin D-Vitamin K (VITAMIN K2-VITAMIN D3 PO) Take by mouth.    . vitamin E 180 MG (400 UNITS) capsule Take 400 Units by mouth daily.    Marland Kitchen zinc gluconate 50 MG tablet Take 50 mg by mouth daily.    Marland Kitchen omega-3 acid ethyl esters (LOVAZA) 1 g capsule TAKE 2 CAPSULES (2 G TOTAL) BY MOUTH 2 (TWO) TIMES DAILY. 360 capsule 1  . budesonide-formoterol (SYMBICORT) 80-4.5 MCG/ACT inhaler Inhale 2 puffs into the lungs 2 (two) times daily. (Patient not taking: Reported on 05/01/2020) 3 Inhaler 4  . rosuvastatin (CRESTOR) 10 MG tablet TAKE 1 TABLET BY  MOUTH EVERY DAY 90 tablet 1   No facility-administered medications prior to visit.    ROS Review of Systems  Constitutional: Positive for unexpected weight change (wt gain). Negative for appetite change, chills, diaphoresis and fatigue.  Eyes: Negative.   Respiratory: Negative.  Negative for cough, chest tightness, shortness of breath and wheezing.   Cardiovascular: Negative for chest pain, palpitations and leg swelling.  Gastrointestinal: Negative for abdominal pain, constipation, diarrhea, nausea and vomiting.  Endocrine: Negative.   Genitourinary: Negative.  Negative for difficulty urinating.  Musculoskeletal: Negative for arthralgias.  Skin: Negative.  Negative for color change.  Neurological: Negative.  Negative for dizziness, weakness and light-headedness.  Hematological: Negative for adenopathy. Does not bruise/bleed easily.  Psychiatric/Behavioral: Negative.     Objective:  BP 128/84 (BP Location: Left Arm, Patient Position: Sitting, Cuff Size: Large)   Pulse 80   Temp 98.1 F (36.7 C) (Oral)   Resp 16   Ht 5' 1"  (1.549 m)   Wt 182 lb 3.2 oz (82.6 kg)   LMP 01/27/2009   SpO2 96%   BMI 34.43 kg/m   BP Readings from Last 3 Encounters:  05/01/20 (!) 162/78  05/01/20 128/84  04/11/19 124/74    Wt Readings from Last 3 Encounters:  05/01/20 182 lb 4.8 oz (82.7 kg)  05/01/20 182  lb 3.2 oz (82.6 kg)  04/11/19 163 lb 4 oz (74 kg)    Physical Exam Vitals reviewed.  Constitutional:      Appearance: She is obese.  HENT:     Nose: Nose normal.     Mouth/Throat:     Mouth: Mucous membranes are moist.  Eyes:     General: No scleral icterus.    Conjunctiva/sclera: Conjunctivae normal.  Cardiovascular:     Rate and Rhythm: Normal rate and regular rhythm.     Pulses: Normal pulses.     Heart sounds: No murmur heard.   Pulmonary:     Effort: Pulmonary effort is normal.     Breath sounds: No stridor. No wheezing, rhonchi or rales.  Abdominal:     General:  Abdomen is protuberant. Bowel sounds are normal. There is no distension.     Palpations: Abdomen is soft. There is no hepatomegaly, splenomegaly or mass.     Tenderness: There is no abdominal tenderness.  Musculoskeletal:        General: Normal range of motion.     Cervical back: Neck supple. No tenderness.     Right lower leg: No edema.     Left lower leg: No edema.  Skin:    General: Skin is warm and dry.  Neurological:     General: No focal deficit present.     Mental Status: She is alert.     Lab Results  Component Value Date   WBC 6.8 05/01/2020   HGB 13.6 05/01/2020   HCT 39.5 05/01/2020   PLT 188.0 05/01/2020   GLUCOSE 101 (H) 05/01/2020   CHOL 188 05/01/2020   TRIG 290.0 (H) 05/01/2020   HDL 74.50 05/01/2020   LDLDIRECT 91.0 05/01/2020   LDLCALC 66 04/11/2019   ALT 27 05/01/2020   AST 26 05/01/2020   NA 140 05/01/2020   K 4.2 05/01/2020   CL 103 05/01/2020   CREATININE 0.98 05/01/2020   BUN 19 05/01/2020   CO2 28 05/01/2020   TSH 1.71 05/01/2020   INR 1.0 05/01/2020   HGBA1C 6.2 05/01/2020    MM DIAG BREAST W/IMPLANT TOMO BILATERAL  Result Date: 02/25/2016 CLINICAL DATA:  Intermittent tenderness to palpation in the axillary tail regions of both breasts, radiating to the axillae, for the past 3 months. EXAM: 2D DIGITAL DIAGNOSTIC BILATERAL MAMMOGRAM WITH IMPLANTS, CAD AND ADJUNCT TOMO The patient has retroglandular implants. Standard and implant displaced views were performed. COMPARISON:  Previous exam(s). ACR Breast Density Category c: The breast tissue is heterogeneously dense, which may obscure small masses. FINDINGS: 2D and 3D tomographic images of the breasts demonstrate bilateral extracapsular silicone. There are no interval findings suspicious for malignancy. Mammographic images were processed with CAD. IMPRESSION: Bilateral extracapsular silicone breast implant rupture. No evidence of malignancy. RECOMMENDATION: Bilateral screening mammogram in 1 year. I  have discussed the findings and recommendations with the patient. Results were also provided in writing at the conclusion of the visit. If applicable, a reminder letter will be sent to the patient regarding the next appointment. BI-RADS CATEGORY  2: Benign. Electronically Signed   By: Claudie Revering M.D.   On: 02/25/2016 13:35    Assessment & Plan:   Meena was seen today for annual exam and hyperlipidemia.  Diagnoses and all orders for this visit:  NASH (nonalcoholic steatohepatitis)- Her LFTs are normal now. -     Hepatic function panel; Future -     Protime-INR; Future -     Protime-INR -  Hepatic function panel  Routine general medical examination at a health care facility- Exam completed, labs reviewed, vaccines reviewed, cancer screenings addressed, patient education was given.  Hyperlipidemia LDL goal <130- She has achieved her LDL goal is doing well on the statin. -     rosuvastatin (CRESTOR) 10 MG tablet; Take 1 tablet (10 mg total) by mouth daily. -     Lipid panel; Future -     TSH; Future -     Hepatic function panel; Future -     Hepatic function panel -     TSH -     Lipid panel  Prediabetes- Her A1c is at 6.2%.  Medical therapy is not yet indicated.  She will continue to work on her lifestyle modifications. -     Basic metabolic panel; Future -     Hemoglobin A1c; Future -     Hemoglobin A1c -     Basic metabolic panel  Mild intermittent asthma without complication -     budesonide-formoterol (SYMBICORT) 80-4.5 MCG/ACT inhaler; Inhale 2 puffs into the lungs 2 (two) times daily. -     CBC with Differential/Platelet; Future -     CBC with Differential/Platelet  Screen for colon cancer -     Cologuard  Pure hyperglyceridemia- Her triglycerides remain elevated.  I have encouraged her to be more compliant with the omega-3 fish oil supplement and to improve her lifestyle modifications. -     omega-3 acid ethyl esters (LOVAZA) 1 g capsule; Take 2 capsules (2 g  total) by mouth 2 (two) times daily.  Other orders -     LDL cholesterol, direct   I have changed Monica Barnes "Debbie"'s rosuvastatin. I am also having her maintain her Carbonyl Iron (PERFECT IRON PO), ALPRAZolam, B Complex-C-Folic Acid (STRESS B COMPLEX PO), FLUoxetine, zinc gluconate, vitamin E, Vitamin D-Vitamin K (VITAMIN K2-VITAMIN D3 PO), TURMERIC PO, vitamin C, cholecalciferol, ferrous sulfate, dexlansoprazole, budesonide-formoterol, and omega-3 acid ethyl esters.  Meds ordered this encounter  Medications  . rosuvastatin (CRESTOR) 10 MG tablet    Sig: Take 1 tablet (10 mg total) by mouth daily.    Dispense:  90 tablet    Refill:  1  . budesonide-formoterol (SYMBICORT) 80-4.5 MCG/ACT inhaler    Sig: Inhale 2 puffs into the lungs 2 (two) times daily.    Dispense:  3 each    Refill:  1  . omega-3 acid ethyl esters (LOVAZA) 1 g capsule    Sig: Take 2 capsules (2 g total) by mouth 2 (two) times daily.    Dispense:  360 capsule    Refill:  1     Follow-up: Return in about 6 months (around 10/31/2020).  [name], MD

## 2020-05-01 NOTE — Progress Notes (Signed)
Subjective:   Monica Barnes is a 65 y.o. female who presents for Medicare Annual (Subsequent) preventive examination.  Review of Systems    No ROS. Medicare Wellness Visit. Additional risk factors are reflected in social history. Cardiac Risk Factors include: advanced age (>36mn, >>23women);dyslipidemia;family history of premature cardiovascular disease     Objective:    Today's Vitals   05/01/20 1524  BP: (!) 162/78  Pulse: 80  Temp: 98.1 F (36.7 C)  SpO2: 96%  Weight: 182 lb 4.8 oz (82.7 kg)  Height: 5' 1"  (1.549 m)  PainSc: 0-No pain   Body mass index is 34.44 kg/m.  Advanced Directives 05/01/2020  Does Patient Have a Medical Advance Directive? Yes  Type of Advance Directive Living will;Healthcare Power of Attorney  Does patient want to make changes to medical advance directive? No - Patient declined  [place]? [place] - copy requested    Current Medications (verified) Outpatient Encounter Medications as of 05/01/2020  Medication Sig  . ALPRAZolam (XANAX) 0.5 MG tablet Take 0.5 mg by mouth 3 (three) times daily as needed.  . Ascorbic Acid (VITAMIN C) 1000 MG tablet Take 1,000 mg by mouth daily.  . B Complex-C-Folic Acid (STRESS B COMPLEX PO) Once a day  . Carbonyl Iron (PERFECT IRON PO) Once a day  . cholecalciferol (VITAMIN D3) 25 MCG (1000 UNIT) tablet Take 1,000 Units by mouth daily.  .Marland Kitchendexlansoprazole (DEXILANT) 60 MG capsule Take 1 capsule (60 mg total) by mouth daily.  . ferrous sulfate 325 (65 FE) MG tablet Take 325 mg by mouth daily with breakfast.  . FLUoxetine (PROZAC) 20 MG capsule Take 20 mg by mouth daily.  .Marland Kitchenomega-3 acid ethyl esters (LOVAZA) 1 g capsule TAKE 2 CAPSULES (2 G TOTAL) BY MOUTH 2 (TWO) TIMES DAILY.  . rosuvastatin (CRESTOR) 10 MG tablet Take 1 tablet (10 mg total) by mouth daily.  . TURMERIC PO Take by mouth.  . Vitamin D-Vitamin K (VITAMIN K2-VITAMIN D3 PO) Take by mouth.  . vitamin E 180 MG (400 UNITS)  capsule Take 400 Units by mouth daily.  .Marland Kitchenzinc gluconate 50 MG tablet Take 50 mg by mouth daily.  . [DISCONTINUED] budesonide-formoterol (SYMBICORT) 80-4.5 MCG/ACT inhaler Inhale 2 puffs into the lungs 2 (two) times daily. (Patient not taking: Reported on 05/01/2020)   No facility-administered encounter medications on file as of 05/01/2020.    Allergies (verified) Penicillins and Latex   History: Past Medical History:  Diagnosis Date  . Anemia   . Asthma   . Depression   . Genital warts   . Headache(784.0)   . Heart murmur   . Restless legs 09/20/2010  . Upper airway resistance syndrome 09/03/2010   Past Surgical History:  Procedure Laterality Date  . APPENDECTOMY  1960  . removal birth mark  [place]  left arm  . TUBAL LIGATION  1983   Family History  Problem Relation Age of Onset  . Throat cancer Father   . Liver cancer Father   . Alcohol abuse Father   . Hypertension Father   . Pancreatic cancer Father   . Emphysema Mother   . COPD Mother   . Asthma Mother   . Alcohol abuse Other   . Drug abuse Other   . Heart disease Maternal Grandmother   . Lung cancer Paternal Uncle   . Prostate cancer Maternal Grandfather   . Colon cancer Neg Hx   . Stomach cancer  Neg Hx   . Stroke Neg Hx   . Early death Neg Hx   . Hyperlipidemia Neg Hx    Social History   Socioeconomic History  . Marital status: Married    Spouse name: Not on file  . Number of children: Not on file  . Years of education: Not on file  . Highest education level: Not on file  Occupational History  . Occupation: Conservator, museum/gallery REP    Employer: [employer]  Tobacco Use  . Smoking status: Light Tobacco Smoker    Packs/day: 0.30    Years: 40.00    Pack years: 12.00    Types: Cigarettes  . Smokeless tobacco: Never Used  . Tobacco comment: smokes 1 pack cigarettes a month  Substance and Sexual Activity  . Alcohol use: Yes    Alcohol/week: 28.0 standard drinks    Types: 28  Glasses of wine per week  . Drug use: No  . Sexual activity: Yes    Birth control/protection: Post-menopausal  Other Topics Concern  . Not on file  Social History Narrative   Caffienated drinks-yes   Seat belt use often-yes   Regular Exercise-no   Smoke alarm in the home-no   Firearms/guns in the home-yes   History of physical abuse-no               Social Determinants of Health   Financial Resource Strain: Low Risk   . Difficulty of Paying Living Expenses: Not hard at all  Food Insecurity: No Food Insecurity  . Worried About Charity fundraiser in the Last Year: Never true  . Ran Out of Food in the Last Year: Never true  Transportation Needs: No Transportation Needs  . Lack of Transportation (Medical): No  . Lack of Transportation (Non-Medical): No  Physical Activity: Inactive  . Days of Exercise per Week: 0 days  . Minutes of Exercise per Session: 0 min  Stress: Stress Concern Present  . Feeling of Stress : Very much  Social Connections: Socially Integrated  . Frequency of Communication with Friends and Family: More than three times a week  . Frequency of Social Gatherings with Friends and Family: More than three times a week  . Attends Religious Services: More than 4 times per year  . Active Member of Clubs or Organizations: Yes  . Attends Archivist Meetings: More than 4 times per year  . Marital Status: Married    Tobacco Counseling Ready to quit: Not Answered Counseling given: Not Answered Comment: smokes 1 pack cigarettes a month   Clinical Intake:  Pre-visit preparation completed: Yes  Pain : No/denies pain Pain Score: 0-No pain     BMI - recorded: 34.44 Nutritional Status: BMI > 30  Obese Nutritional Risks: None Diabetes: No  How often do you need to have someone help you when you read instructions, pamphlets, or other written materials from your doctor or pharmacy?: 1 - Never What is the last grade level you completed in school?: [place]; some college  Diabetic? no  Interpreter Needed?: No  Information entered by :: Lisette Abu, LPN   Activities of Daily Living In your present state of health, do you have any difficulty performing the following activities: 05/01/2020 05/01/2020  Hearing? N N  Vision? N N  Difficulty concentrating or making decisions? N N  Walking or climbing stairs? N N  Dressing or bathing? N N  Doing errands, shopping? N N  Preparing Food and eating ? N -  Using the Toilet? N -  In the past six months, have you accidently leaked urine? N -  Do you have problems with loss of bowel control? N -  Managing your Medications? N -  Managing your Finances? N -  Housekeeping or managing your Housekeeping? N -  Some recent data might be hidden    Patient Care Team: Janith Lima, MD as PCP - General (Internal Medicine) Maisie Fus, MD (Obstetrics and Gynecology)  Indicate any recent Medical Services you may have received from other than [facility] providers in the past year (date may be approximate).     Assessment:   This is a routine wellness examination for Bev.  Hearing/Vision screen No exam data present  Dietary issues and exercise activities discussed: Current Exercise Habits: The patient does not participate in regular exercise at present, Exercise limited by: respiratory conditions(s);psychological condition(s)  Goals   None    Depression Screen PHQ 2/9 Scores 05/01/2020 05/01/2020 04/11/2019 04/08/2018 04/05/2016 03/20/2015 03/19/2015  PHQ - 2 Score 2 1 0 0 0 0 0  PHQ- 9 Score 7 - 0 1 - - -    Fall Risk Fall Risk  05/01/2020 04/11/2019 04/08/2018 04/05/2016 03/19/2015  Falls in the past year? 0 0 0 No No  Number falls in past yr: 0 0 0 - -  Injury with Fall? 0 0 0 - -  Follow up - Falls evaluation completed Falls evaluation completed - -    FALL RISK PREVENTION PERTAINING TO THE HOME:  Any stairs in or around the home? Yes  If so, are there any without handrails?  No  Home free of loose throw rugs in walkways, pet beds, electrical cords, etc? Yes  Adequate lighting in your home to reduce risk of falls? Yes   ASSISTIVE DEVICES UTILIZED TO PREVENT FALLS:  Life alert? No  Use of a cane, walker or w/c? No  Grab bars in the bathroom? No  Shower chair or bench in shower? No  Elevated toilet seat or a handicapped toilet? Yes   TIMED UP AND GO:  Was the test performed? No .  Length of time to ambulate 10 feet: 0 sec.   Gait steady and fast without use of assistive device  Cognitive Function: Normal cognitive status assessed by direct observation by this Nurse Health Advisor. No abnormalities found.          Immunizations Immunization History  Administered Date(s) Administered  . Influenza Whole 11/10/2012  . Pneumococcal Conjugate-13 04/11/2019  . Pneumococcal Polysaccharide-23 08/30/2008, 04/06/2017  . Tdap 07/22/2007, 10/22/2015    TDAP status: Up to date  Flu Vaccine status: Declined, Education has been provided regarding the importance of this vaccine but patient still declined. Advised may receive this vaccine at local pharmacy or Health Dept. Aware to provide a copy of the vaccination record if obtained from local pharmacy or Health Dept. Verbalized acceptance and understanding.  Pneumococcal vaccine status: Up to date  Covid-19 vaccine status: Declined, Education has been provided regarding the importance of this vaccine but patient still declined. Advised may receive this vaccine at local pharmacy or Health Dept.or vaccine clinic. Aware to provide a copy of the vaccination record if obtained from local pharmacy or Health Dept. Verbalized acceptance and understanding.  Qualifies for Shingles Vaccine? Yes   Zostavax completed No   Shingrix Completed?: No.    Education has been provided regarding the importance of this vaccine. Patient has been advised  to call insurance company to determine out of pocket expense if they have not yet  received this vaccine. Advised may also receive vaccine at local pharmacy or Health Dept. Verbalized acceptance and understanding.  Screening Tests Health Maintenance  Topic Date Due  . COVID-19 Vaccine (1) Never done  . Fecal DNA (Cologuard)  04/22/2019  . MAMMOGRAM  04/28/2020  . INFLUENZA VACCINE  08/27/2020  . PNA vac Low Risk Adult (2 of 2 - PPSV23) 04/07/2022  . TETANUS/TDAP  10/21/2025  . DEXA SCAN  Completed  . Hepatitis C Screening  Completed  . HPV VACCINES  Aged Out    Health Maintenance  Health Maintenance Due  Topic Date Due  . COVID-19 Vaccine (1) Never done  . Fecal DNA (Cologuard)  04/22/2019  . MAMMOGRAM  04/28/2020    Colorectal cancer screening: Type of screening: Cologuard. Completed 04/21/2016. Repeat every 3 years  Mammogram status: Completed 04/29/2018. Repeat every year (scheduled for 05/16/2020 per patient)  Bone Density status: Completed 01/09/2015. Results reflect: Bone density results: NORMAL. Repeat every n/a years.  Lung Cancer Screening: (Low Dose CT Chest recommended if Age 10-80 years, 30 pack-year currently smoking OR have quit w/in 15years.) does qualify.   Lung Cancer Screening Referral: no  Additional Screening:  Hepatitis C Screening: does qualify; Completed yes  Vision Screening: Recommended annual ophthalmology exams for early detection of glaucoma and other disorders of the eye. Is the patient up to date with their annual eye exam?  Yes  Who is the provider or what is the name of the office in which the patient attends annual eye exams? [facility] A. [facility] O.D. If pt is not established with a provider, would they like to be referred to a provider to establish care? No .   Dental Screening: Recommended annual dental exams for proper oral hygiene  Community Resource Referral / Chronic Care Management: CRR required this visit?  No   CCM required this visit?  No      Plan:     I have personally reviewed and noted the following in  the patient's chart:   . Medical and social history . Use of alcohol, tobacco or illicit drugs  . Current medications and supplements . Functional ability and status . Nutritional status . Physical activity . Advanced directives . List of other physicians . Hospitalizations, surgeries, and ER visits in previous 12 months . Vitals . Screenings to include cognitive, depression, and falls . Referrals and appointments  In addition, I have reviewed and discussed with patient certain preventive protocols, quality metrics, and best practice recommendations. A written personalized care plan for preventive services as well as general preventive health recommendations were provided to patient.     Sheral Flow, LPN   05/30/6142   Nurse Notes:  Medications reviewed with patient; no opioid use noted.

## 2020-05-01 NOTE — Patient Instructions (Signed)
Health Maintenance, Female Adopting a healthy lifestyle and getting preventive care are important in promoting health and wellness. Ask your health care provider about:  The right schedule for you to have regular tests and exams.  Things you can do on your own to prevent diseases and keep yourself healthy. What should I know about diet, weight, and exercise? Eat a healthy diet  Eat a diet that includes plenty of vegetables, fruits, low-fat dairy products, and lean protein.  Do not eat a lot of foods that are high in solid fats, added sugars, or sodium.   Maintain a healthy weight Body mass index (BMI) is used to identify weight problems. It estimates body fat based on height and weight. Your health care provider can help determine your BMI and help you achieve or maintain a healthy weight. Get regular exercise Get regular exercise. This is one of the most important things you can do for your health. Most adults should:  Exercise for at least 150 minutes each week. The exercise should increase your heart rate and make you sweat (moderate-intensity exercise).  Do strengthening exercises at least twice a week. This is in addition to the moderate-intensity exercise.  Spend less time sitting. Even light physical activity can be beneficial. Watch cholesterol and blood lipids Have your blood tested for lipids and cholesterol at 66 years of age, then have this test every 5 years. Have your cholesterol levels checked more often if:  Your lipid or cholesterol levels are high.  You are older than 66 years of age.  You are at high risk for heart disease. What should I know about cancer screening? Depending on your health history and family history, you may need to have cancer screening at various ages. This may include screening for:  Breast cancer.  Cervical cancer.  Colorectal cancer.  Skin cancer.  Lung cancer. What should I know about heart disease, diabetes, and high blood  pressure? Blood pressure and heart disease  High blood pressure causes heart disease and increases the risk of stroke. This is more likely to develop in people who have high blood pressure readings, are of African descent, or are overweight.  Have your blood pressure checked: ? Every 3-5 years if you are 18-39 years of age. ? Every year if you are 40 years old or older. Diabetes Have regular diabetes screenings. This checks your fasting blood sugar level. Have the screening done:  Once every three years after age 40 if you are at a normal weight and have a low risk for diabetes.  More often and at a younger age if you are overweight or have a high risk for diabetes. What should I know about preventing infection? Hepatitis B If you have a higher risk for hepatitis B, you should be screened for this virus. Talk with your health care provider to find out if you are at risk for hepatitis B infection. Hepatitis C Testing is recommended for:  Everyone born from 1945 through 1965.  Anyone with known risk factors for hepatitis C. Sexually transmitted infections (STIs)  Get screened for STIs, including gonorrhea and chlamydia, if: ? You are sexually active and are younger than 66 years of age. ? You are older than 66 years of age and your health care provider tells you that you are at risk for this type of infection. ? Your sexual activity has changed since you were last screened, and you are at increased risk for chlamydia or gonorrhea. Ask your health care provider   if you are at risk.  Ask your health care provider about whether you are at high risk for HIV. Your health care provider may recommend a prescription medicine to help prevent HIV infection. If you choose to take medicine to prevent HIV, you should first get tested for HIV. You should then be tested every 3 months for as long as you are taking the medicine. Pregnancy  If you are about to stop having your period (premenopausal) and  you may become pregnant, seek counseling before you get pregnant.  Take 400 to 800 micrograms (mcg) of folic acid every day if you become pregnant.  Ask for birth control (contraception) if you want to prevent pregnancy. Osteoporosis and menopause Osteoporosis is a disease in which the bones lose minerals and strength with aging. This can result in bone fractures. If you are 65 years old or older, or if you are at risk for osteoporosis and fractures, ask your health care provider if you should:  Be screened for bone loss.  Take a calcium or vitamin D supplement to lower your risk of fractures.  Be given hormone replacement therapy (HRT) to treat symptoms of menopause. Follow these instructions at home: Lifestyle  Do not use any products that contain nicotine or tobacco, such as cigarettes, e-cigarettes, and chewing tobacco. If you need help quitting, ask your health care provider.  Do not use street drugs.  Do not share needles.  Ask your health care provider for help if you need support or information about quitting drugs. Alcohol use  Do not drink alcohol if: ? Your health care provider tells you not to drink. ? You are pregnant, may be pregnant, or are planning to become pregnant.  If you drink alcohol: ? Limit how much you use to 0-1 drink a day. ? Limit intake if you are breastfeeding.  Be aware of how much alcohol is in your drink. In the [place] one drink equals one 12 oz bottle of beer (355 mL), one 5 oz glass of wine (148 mL), or one 1 oz glass of hard liquor (44 mL). General instructions  Schedule regular health, dental, and eye exams.  Stay current with your vaccines.  Tell your health care provider if: ? You often feel depressed. ? You have ever been abused or do not feel safe at home. Summary  Adopting a healthy lifestyle and getting preventive care are important in promoting health and wellness.  Follow your health care provider's instructions about healthy  diet, exercising, and getting tested or screened for diseases.  Follow your health care provider's instructions on monitoring your cholesterol and blood pressure. This information is not intended to replace advice given to you by your health care provider. Make sure you discuss any questions you have with your health care provider. Document Revised: 01/06/2018 Document Reviewed: 01/06/2018 Elsevier Patient Education  2021 [employer]  

## 2020-05-07 LAB — COLOGUARD: Cologuard: NEGATIVE

## 2020-05-11 LAB — COLOGUARD: COLOGUARD: NEGATIVE

## 2020-05-14 ENCOUNTER — Encounter: Payer: Self-pay | Admitting: Internal Medicine

## 2020-07-29 ENCOUNTER — Other Ambulatory Visit: Payer: Self-pay | Admitting: Internal Medicine

## 2020-07-29 DIAGNOSIS — E781 Pure hyperglyceridemia: Secondary | ICD-10-CM

## 2020-08-08 ENCOUNTER — Telehealth: Payer: Self-pay

## 2020-08-08 NOTE — Telephone Encounter (Signed)
Key: [id number]

## 2020-08-08 NOTE — Telephone Encounter (Signed)
Approved 05/10/2020 - 08/08/2021

## 2020-10-19 ENCOUNTER — Other Ambulatory Visit: Payer: Self-pay | Admitting: Internal Medicine

## 2020-10-27 ENCOUNTER — Other Ambulatory Visit: Payer: Self-pay | Admitting: Internal Medicine

## 2020-10-27 DIAGNOSIS — E785 Hyperlipidemia, unspecified: Secondary | ICD-10-CM

## 2021-01-31 ENCOUNTER — Other Ambulatory Visit: Payer: Self-pay | Admitting: Internal Medicine

## 2021-01-31 DIAGNOSIS — E781 Pure hyperglyceridemia: Secondary | ICD-10-CM

## 2021-03-27 DIAGNOSIS — S8290XA Unspecified fracture of unspecified lower leg, initial encounter for closed fracture: Secondary | ICD-10-CM | POA: Insufficient documentation

## 2021-04-12 ENCOUNTER — Telehealth: Payer: Self-pay

## 2021-04-12 NOTE — Telephone Encounter (Signed)
[place] Patient assistant program app dexlansoprazole ([place]) 60 MG capsule ? ?[phone] ? ? ?

## 2021-04-16 ENCOUNTER — Other Ambulatory Visit: Payer: Self-pay | Admitting: Internal Medicine

## 2021-04-16 DIAGNOSIS — E785 Hyperlipidemia, unspecified: Secondary | ICD-10-CM

## 2021-05-04 ENCOUNTER — Other Ambulatory Visit: Payer: Self-pay | Admitting: Internal Medicine

## 2021-05-04 DIAGNOSIS — E785 Hyperlipidemia, unspecified: Secondary | ICD-10-CM

## 2021-05-13 ENCOUNTER — Ambulatory Visit (INDEPENDENT_AMBULATORY_CARE_PROVIDER_SITE_OTHER): Payer: Medicare HMO

## 2021-05-13 DIAGNOSIS — Z0000 Encounter for general adult medical examination without abnormal findings: Secondary | ICD-10-CM

## 2021-05-13 NOTE — Patient Instructions (Signed)
Monica Barnes , ?Thank you for taking time to come for your Medicare Wellness Visit. I appreciate your ongoing commitment to your health goals. Please review the following plan we discussed and let me know if I can assist you in the future.  ? ?Screening recommendations/referrals: ?Colonoscopy: Cologuard 05/07/2020 ?Mammogram: scheduled 05/30/2021 ?Bone Density: patient will schedule  ?Recommended yearly ophthalmology/optometry visit for glaucoma screening and checkup ?Recommended yearly dental visit for hygiene and checkup ? ?Vaccinations: ?Influenza vaccine: declined  ?Pneumococcal vaccine: completed  ?Tdap vaccine: 10/22/2015 ?Shingles vaccine: will consider    ? ?Advanced directives: yes  ? ?Conditions/risks identified: none  ? ?Next appointment: none  ? ? ?Preventive Care 67 Years and Older, Female ?Preventive care refers to lifestyle choices and visits with your health care provider that can promote health and wellness. ?What does preventive care include? ?A yearly physical exam. This is also called an annual well check. ?Dental exams once or twice a year. ?Routine eye exams. Ask your health care provider how often you should have your eyes checked. ?Personal lifestyle choices, including: ?Daily care of your teeth and gums. ?Regular physical activity. ?Eating a healthy diet. ?Avoiding tobacco and drug use. ?Limiting alcohol use. ?Practicing safe sex. ?Taking low-dose aspirin every day. ?Taking vitamin and mineral supplements as recommended by your health care provider. ?What happens during an annual well check? ?The services and screenings done by your health care provider during your annual well check will depend on your age, overall health, lifestyle risk factors, and family history of disease. ?Counseling  ?Your health care provider may ask you questions about your: ?Alcohol use. ?Tobacco use. ?Drug use. ?Emotional well-being. ?Home and relationship well-being. ?Sexual activity. ?Eating habits. ?History of  falls. ?Memory and ability to understand (cognition). ?Work and work Statistician. ?Reproductive health. ?Screening  ?You may have the following tests or measurements: ?Height, weight, and BMI. ?Blood pressure. ?Lipid and cholesterol levels. These may be checked every 5 years, or more frequently if you are over 52 years old. ?Skin check. ?Lung cancer screening. You may have this screening every year starting at age 67 if you have a 30-pack-year history of smoking and currently smoke or have quit within the past 15 years. ?Fecal occult blood test (FOBT) of the stool. You may have this test every year starting at age 67. ?Flexible sigmoidoscopy or colonoscopy. You may have a sigmoidoscopy every 5 years or a colonoscopy every 10 years starting at age 67. ?Hepatitis C blood test. ?Hepatitis B blood test. ?Sexually transmitted disease (STD) testing. ?Diabetes screening. This is done by checking your blood sugar (glucose) after you have not eaten for a while (fasting). You may have this done every 1-3 years. ?Bone density scan. This is done to screen for osteoporosis. You may have this done starting at age 36. ?Mammogram. This may be done every 1-2 years. Talk to your health care provider about how often you should have regular mammograms. ?Talk with your health care provider about your test results, treatment options, and if necessary, the need for more tests. ?Vaccines  ?Your health care provider may recommend certain vaccines, such as: ?Influenza vaccine. This is recommended every year. ?Tetanus, diphtheria, and acellular pertussis (Tdap, Td) vaccine. You may need a Td booster every 10 years. ?Zoster vaccine. You may need this after age 67. ?Pneumococcal 13-valent conjugate (PCV13) vaccine. One dose is recommended after age 67. ?Pneumococcal polysaccharide (PPSV23) vaccine. One dose is recommended after age 67. ?Talk to your health care provider about which screenings and vaccines  you need and how often you need  them. ?This information is not intended to replace advice given to you by your health care provider. Make sure you discuss any questions you have with your health care provider. ?Document Released: 02/09/2015 Document Revised: 10/03/2015 Document Reviewed: 11/14/2014 ?Elsevier Interactive Patient Education ? 2017 [place]. ? ?Fall Prevention in the Home ?Falls can cause injuries. They can happen to people of all ages. There are many things you can do to make your home safe and to help prevent falls. ?What can I do on the outside of my home? ?Regularly fix the edges of walkways and driveways and fix any cracks. ?Remove anything that might make you trip as you walk through a door, such as a raised step or threshold. ?Trim any bushes or trees on the path to your home. ?Use bright outdoor lighting. ?Clear any walking paths of anything that might make someone trip, such as rocks or tools. ?Regularly check to see if handrails are loose or broken. Make sure that both sides of any steps have handrails. ?Any raised decks and porches should have guardrails on the edges. ?Have any leaves, snow, or ice cleared regularly. ?Use sand or salt on walking paths during winter. ?Clean up any spills in your garage right away. This includes oil or grease spills. ?What can I do in the bathroom? ?Use night lights. ?Install grab bars by the toilet and in the tub and shower. Do not use towel bars as grab bars. ?Use non-skid mats or decals in the tub or shower. ?If you need to sit down in the shower, use a plastic, non-slip stool. ?Keep the floor dry. Clean up any water that spills on the floor as soon as it happens. ?Remove soap buildup in the tub or shower regularly. ?Attach bath mats securely with double-sided non-slip rug tape. ?Do not have throw rugs and other things on the floor that can make you trip. ?What can I do in the bedroom? ?Use night lights. ?Make sure that you have a light by your bed that is easy to reach. ?Do not use  any sheets or blankets that are too big for your bed. They should not hang down onto the floor. ?Have a firm chair that has side arms. You can use this for support while you get dressed. ?Do not have throw rugs and other things on the floor that can make you trip. ?What can I do in the kitchen? ?Clean up any spills right away. ?Avoid walking on wet floors. ?Keep items that you use a lot in easy-to-reach places. ?If you need to reach something above you, use a strong step stool that has a grab bar. ?Keep electrical cords out of the way. ?Do not use floor polish or wax that makes floors slippery. If you must use wax, use non-skid floor wax. ?Do not have throw rugs and other things on the floor that can make you trip. ?What can I do with my stairs? ?Do not leave any items on the stairs. ?Make sure that there are handrails on both sides of the stairs and use them. Fix handrails that are broken or loose. Make sure that handrails are as long as the stairways. ?Check any carpeting to make sure that it is firmly attached to the stairs. Fix any carpet that is loose or worn. ?Avoid having throw rugs at the top or bottom of the stairs. If you do have throw rugs, attach them to the floor with carpet tape. ?Make sure  that you have a light switch at the top of the stairs and the bottom of the stairs. If you do not have them, ask someone to add them for you. ?What else can I do to help prevent falls? ?Wear shoes that: ?Do not have high heels. ?Have rubber bottoms. ?Are comfortable and fit you well. ?Are closed at the toe. Do not wear sandals. ?If you use a stepladder: ?Make sure that it is fully opened. Do not climb a closed stepladder. ?Make sure that both sides of the stepladder are locked into place. ?Ask someone to hold it for you, if possible. ?Clearly mark and make sure that you can see: ?Any grab bars or handrails. ?First and last steps. ?Where the edge of each step is. ?Use tools that help you move around (mobility aids)  if they are needed. These include: ?Canes. ?Walkers. ?Scooters. ?Crutches. ?Turn on the lights when you go into a dark area. Replace any light bulbs as soon as they burn out. ?Set up your furniture so you have a cl

## 2021-05-13 NOTE — Progress Notes (Signed)
? ?Subjective:  ? Monica Barnes is a 66 y.o. female who presents for Medicare Annual (Subsequent) preventive examination. ? ? ?I connected with Sonda Primes today by telephone and verified that I am speaking with the correct person using two identifiers. ?Location patient: home ?Location provider: work ?Persons participating in the virtual visit: patient, provider. ?  ?I discussed the limitations, risks, security and privacy concerns of performing an evaluation and management service by telephone and the availability of in person appointments. I also discussed with the patient that there may be a patient responsible charge related to this service. The patient expressed understanding and verbally consented to this telephonic visit.  ?  ?Interactive audio and video telecommunications were attempted between this provider and patient, however failed, due to patient having technical difficulties OR patient did not have access to video capability.  We continued and completed visit with audio only. ? ?  ?Review of Systems    ? ?Cardiac Risk Factors include: advanced age (>71mn, >>5women) ? ?   ?Objective:  ?  ?Today's Vitals  ? ?There is no height or weight on file to calculate BMI. ? ? ?  05/13/2021  ?  1:31 PM 05/01/2020  ?  3:47 PM  ?Advanced Directives  ?Does Patient Have a Medical Advance Directive? Yes Yes  ?Type of Advance Directive Healthcare Power of Attorney Living will;Healthcare Power of Attorney  ?Does patient want to make changes to medical advance directive?  No - Patient declined  ?Copy of [place]? No - copy requested No - copy requested  ? ? ?Current Medications (verified) ?Outpatient Encounter Medications as of 05/13/2021  ?Medication Sig  ? ALPRAZolam (XANAX) 0.5 MG tablet Take 0.5 mg by mouth 3 (three) times daily as needed.  ? Ascorbic Acid (VITAMIN C) 1000 MG tablet Take 1,000 mg by mouth daily.  ? B Complex-C-Folic Acid (STRESS B COMPLEX PO) Once a day  ?  budesonide-formoterol (SYMBICORT) 80-4.5 MCG/ACT inhaler Inhale 2 puffs into the lungs 2 (two) times daily.  ? Carbonyl Iron (PERFECT IRON PO) Once a day  ? cholecalciferol (VITAMIN D3) 25 MCG (1000 UNIT) tablet Take 1,000 Units by mouth daily.  ? dexlansoprazole (DEXILANT) 60 MG capsule Take 1 capsule (60 mg total) by mouth daily.  ? ferrous sulfate 325 (65 FE) MG tablet Take 325 mg by mouth daily with breakfast.  ? FLUoxetine (PROZAC) 20 MG capsule TAKE 1 CAPSULE BY MOUTH EVERY DAY  ? omega-3 acid ethyl esters (LOVAZA) 1 g capsule TAKE 2 CAPSULES BY MOUTH TWICE A DAY  ? rosuvastatin (CRESTOR) 10 MG tablet TAKE 1 TABLET BY MOUTH EVERY DAY  ? TURMERIC PO Take by mouth.  ? Vitamin D-Vitamin K (VITAMIN K2-VITAMIN D3 PO) Take by mouth.  ? vitamin E 180 MG (400 UNITS) capsule Take 400 Units by mouth daily.  ? zinc gluconate 50 MG tablet Take 50 mg by mouth daily.  ? ?No facility-administered encounter medications on file as of 05/13/2021.  ? ? ?Allergies (verified) ?Penicillins and Latex  ? ?History: ?Past Medical History:  ?Diagnosis Date  ? Anemia   ? Asthma   ? Depression   ? Genital warts   ? Headache(784.0)   ? Heart murmur   ? Restless legs 09/20/2010  ? Upper airway resistance syndrome 09/03/2010  ? ?Past Surgical History:  ?Procedure Laterality Date  ? APPENDECTOMY  01/27/1958  ? removal birth mark  [place] ? left arm  ? tibia fx Right   ?  TUBAL LIGATION  01/27/1981  ? ?Family History  ?Problem Relation Age of Onset  ? Throat cancer Father   ? Liver cancer Father   ? Alcohol abuse Father   ? Hypertension Father   ? Pancreatic cancer Father   ? Emphysema Mother   ? COPD Mother   ? Asthma Mother   ? Alcohol abuse Other   ? Drug abuse Other   ? Heart disease Maternal Grandmother   ? Lung cancer Paternal Uncle   ? Prostate cancer Maternal Grandfather   ? Colon cancer Neg Hx   ? Stomach cancer Neg Hx   ? Stroke Neg Hx   ? Early death Neg Hx   ? Hyperlipidemia Neg Hx   ? ?Social History  ? ?Socioeconomic History  ?  Marital status: Married  ?  Spouse name: Not on file  ? Number of children: Not on file  ? Years of education: Not on file  ? Highest education level: Not on file  ?Occupational History  ? Occupation: CUSTOMER SERV REP  ?  Employer: [employer]  ?Tobacco Use  ? Smoking status: Light Smoker  ?  Packs/day: 0.30  ?  Years: 40.00  ?  Pack years: 12.00  ?  Types: Cigarettes  ? Smokeless tobacco: Never  ? Tobacco comments:  ?  smokes 1 pack cigarettes a month  ?Substance and Sexual Activity  ? Alcohol use: Yes  ?  Alcohol/week: 28.0 standard drinks  ?  Types: 28 Glasses of wine per week  ? Drug use: No  ? Sexual activity: Yes  ?  Birth control/protection: Post-menopausal  ?Other Topics Concern  ? Not on file  ?Social History Narrative  ? Caffienated drinks-yes  ? Seat belt use often-yes  ? Regular Exercise-no  ? Smoke alarm in the home-no  ? Firearms/guns in the home-yes  ? History of physical abuse-no  ?   ?   ?   ?   ? ?Social Determinants of Health  ? ?Financial Resource Strain: Low Risk   ? Difficulty of Paying Living Expenses: Not hard at all  ?Food Insecurity: No Food Insecurity  ? Worried About Charity fundraiser in the Last Year: Never true  ? Ran Out of Food in the Last Year: Never true  ?Transportation Needs: No Transportation Needs  ? Lack of Transportation (Medical): No  ? Lack of Transportation (Non-Medical): No  ?Physical Activity: Inactive  ? Days of Exercise per Week: 0 days  ? Minutes of Exercise per Session: 0 min  ?Stress: No Stress Concern Present  ? Feeling of Stress : Not at all  ?Social Connections: Moderately Isolated  ? Frequency of Communication with Friends and Family: Three times a week  ? Frequency of Social Gatherings with Friends and Family: Three times a week  ? Attends Religious Services: Never  ? Active Member of Clubs or Organizations: No  ? Attends Archivist Meetings: Never  ? Marital Status: Married  ? ? ?Tobacco Counseling ?Ready to quit: Not  Answered ?Counseling given: Not Answered ?Tobacco comments: smokes 1 pack cigarettes a month ? ? ?Clinical Intake: ? ?Pre-visit preparation completed: Yes ? ?Pain : No/denies pain ? ?  ? ?Nutritional Risks: None ?Diabetes: No ? ?How often do you need to have someone help you when you read instructions, pamphlets, or other written materials from your doctor or pharmacy?: 1 - Never ?What is the last grade level you completed in school?: college ? ?  Diabetic?no  ? ?Interpreter Needed?: No ? ?Information entered by :: [email or web address] ? ? ?Activities of Daily Living ? ?  05/13/2021  ?  1:34 PM 05/13/2021  ?  6:19 AM  ?In your present state of health, do you have any difficulty performing the following activities:  ?Hearing? 0 0  ?Vision? 0 0  ?Difficulty concentrating or making decisions? 0 0  ?Walking or climbing stairs? 0 1  ?Dressing or bathing? 0 1  ?Doing errands, shopping? 0 1  ?Preparing Food and eating ? N Y  ?Using the Toilet? N Y  ?In the past six months, have you accidently leaked urine? N Y  ?Do you have problems with loss of bowel control? N N  ?Managing your Medications? N Y  ?Managing your Finances? N N  ?Housekeeping or managing your Housekeeping? N Y  ? ? ?Patient Care Team: ?Janith Lima, MD as PCP - General (Internal Medicine) ?Maisie Fus, MD (Obstetrics and Gynecology) ?Christain Sacramento, OD as Referring Physician (Optometry) ? ?Indicate any recent Medical Services you may have received from other than [facility] providers in the past year (date may be approximate). ? ?   ?Assessment:  ? This is a routine wellness examination for Ny[place]. ? ?Hearing/Vision screen ?Vision Screening - Comments:: Annual eye exams wears glasses  ? ?Dietary issues and exercise activities discussed: ?Current Exercise Habits: The patient does not participate in regular exercise at present, Exercise limited by: None identified ? ? Goals Addressed   ?None ?  ? ?Depression Screen ? ?  05/13/2021  ?  1:32 PM 05/13/2021  ?  1:30 PM 05/01/2020   ?  3:27 PM 05/01/2020  ?  2:14 PM 04/11/2019  ? 10:21 AM 04/08/2018  ?  1:41 PM 04/05/2016  ? 11:41 AM  ?PHQ 2/9 Scores  ?PHQ - 2 Score 0 0 2 1 0 0 0  ?PHQ- 9 Score   7  0 1   ?  ?Fall Risk ? ?  05/13/2021  ?  1:31 PM 4/17/202

## 2021-05-14 ENCOUNTER — Other Ambulatory Visit: Payer: Self-pay | Admitting: Internal Medicine

## 2021-05-14 DIAGNOSIS — E781 Pure hyperglyceridemia: Secondary | ICD-10-CM

## 2021-06-13 ENCOUNTER — Other Ambulatory Visit: Payer: Self-pay | Admitting: Otolaryngology

## 2021-06-28 NOTE — Progress Notes (Addendum)
Surgical Instructions    Your procedure is scheduled on Monday, June 12th, 2023.   Report to [facility] Main Entrance "A" at 10:00 A.M., then check in with the Admitting office.  Call this number if you have problems the morning of surgery:  [phone]   If you have any questions prior to your surgery date call [phone]: Open Monday-Friday 8am-4pm    Remember:  Do not eat after midnight the night before your surgery  You may drink clear liquids until 09:00 the morning of your surgery.   Clear liquids allowed are: Water, Non-Citrus Juices (without pulp), Carbonated Beverages, Clear Tea, Black Coffee ONLY (NO MILK, CREAM OR POWDERED CREAMER of any kind), and Gatorade    Take these medicines the morning of surgery with A SIP OF WATER:   dexlansoprazole (DEXILANT) FLUoxetine (PROZAC) rosuvastatin (CRESTOR)   If needed:  ALPRAZolam Duanne Moron) carboxymethylcellulose (REFRESH PLUS)  As of today, STOP taking any Aspirin (unless otherwise instructed by your surgeon) Aleve, Naproxen, Ibuprofen, Motrin, Advil, Goody's, BC's, all herbal medications, fish oil, and all vitamins.    The day of surgery:          Do not wear jewelry or makeup Do not wear lotions, powders, perfumes, or deodorant. Do not shave 48 hours prior to surgery.   Do not bring valuables to the hospital. Do not wear nail polish, gel polish, artificial nails, or any other type of covering on natural nails (fingers and toes) If you have artificial nails or gel coating that need to be removed by a nail salon, please have this removed prior to surgery. Artificial nails or gel coating may interfere with anesthesia's ability to adequately monitor your vital signs.  [facility] is not responsible for any belongings or valuables. .   Do NOT Smoke (Tobacco/Vaping)  24 hours prior to your procedure  If you use a CPAP at night, you may bring your mask for your overnight stay.   Contacts, glasses, hearing aids, dentures or  partials may not be worn into surgery, please bring cases for these belongings   For patients admitted to the hospital, discharge time will be determined by your treatment team.   Patients discharged the day of surgery will not be allowed to drive home, and someone needs to stay with them for 24 hours.   SURGICAL WAITING ROOM VISITATION Patients having surgery or a procedure in a hospital may have two support people. Children under the age of 7 must have an adult with them who is not the patient. They may stay in the waiting area during the procedure and may switch out with other visitors. If the patient needs to stay at the hospital during part of their recovery, the visitor guidelines for inpatient rooms apply.  Please refer to the [facility] website for the visitor guidelines for Inpatients (after your surgery is over and you are in a regular room).    Special instructions:    Oral Hygiene is also important to reduce your risk of infection.  Remember - BRUSH YOUR TEETH THE MORNING OF SURGERY WITH YOUR REGULAR TOOTHPASTE   [place]- Preparing For Surgery  Before surgery, you can play an important role. Because skin is not sterile, your skin needs to be as free of germs as possible. You can reduce the number of germs on your skin by washing with CHG (chlorahexidine gluconate) Soap before surgery.  CHG is an antiseptic cleaner which kills germs and bonds with the skin to continue killing germs even  after washing.     Please do not use if you have an allergy to CHG or antibacterial soaps. If your skin becomes reddened/irritated stop using the CHG.  Do not shave (including legs and underarms) for at least 48 hours prior to first CHG shower. It is OK to shave your face.  Please follow these instructions carefully.     Shower the NIGHT BEFORE SURGERY and the MORNING OF SURGERY with CHG Soap.   If you chose to wash your hair, wash your hair first as usual with your normal shampoo.  After you shampoo, rinse your hair and body thoroughly to remove the shampoo.  Then [employer] and genitals (private parts) with your normal soap and rinse thoroughly to remove soap.  After that Use CHG Soap as you would any other liquid soap. You can apply CHG directly to the skin and wash gently with a scrungie or a clean washcloth.   Apply the CHG Soap to your body ONLY FROM THE NECK DOWN.  Do not use on open wounds or open sores. Avoid contact with your eyes, ears, mouth and genitals (private parts). Wash Face and genitals (private parts)  with your normal soap.   Wash thoroughly, paying special attention to the area where your surgery will be performed.  Thoroughly rinse your body with warm water from the neck down.  DO NOT shower/wash with your normal soap after using and rinsing off the CHG Soap.  Pat yourself dry with a CLEAN TOWEL.  Wear CLEAN PAJAMAS to bed the night before surgery  Place CLEAN SHEETS on your bed the night before your surgery  DO NOT SLEEP WITH PETS.   Day of Surgery:  Take a shower with CHG soap. Wear Clean/Comfortable clothing the morning of surgery Do not apply any deodorants/lotions.   Remember to brush your teeth WITH YOUR REGULAR TOOTHPASTE.    If you received a COVID test during your pre-op visit, it is requested that you wear a mask when out in public, stay away from anyone that may not be feeling well, and notify your surgeon if you develop symptoms. If you have been in contact with anyone that has tested positive in the last 10 days, please notify your surgeon.    Please read over the following fact sheets that you were given.

## 2021-07-01 ENCOUNTER — Encounter (HOSPITAL_COMMUNITY)
Admission: RE | Admit: 2021-07-01 | Discharge: 2021-07-01 | Disposition: A | Discharge status: Home / Self Care | Payer: Medicare HMO | Source: Ambulatory Visit | Attending: Otolaryngology | Admitting: Otolaryngology

## 2021-07-01 ENCOUNTER — Encounter (HOSPITAL_COMMUNITY): Payer: Self-pay

## 2021-07-01 ENCOUNTER — Other Ambulatory Visit: Payer: Self-pay

## 2021-07-01 VITALS — BP 168/91 | HR 93 | Temp 97.8°F | Resp 18 | Ht 61.0 in | Wt 180.0 lb

## 2021-07-01 DIAGNOSIS — G2581 Restless legs syndrome: Secondary | ICD-10-CM | POA: Diagnosis not present

## 2021-07-01 DIAGNOSIS — Z79899 Other long term (current) drug therapy: Secondary | ICD-10-CM | POA: Diagnosis not present

## 2021-07-01 DIAGNOSIS — J45909 Unspecified asthma, uncomplicated: Secondary | ICD-10-CM | POA: Diagnosis not present

## 2021-07-01 DIAGNOSIS — D37030 Neoplasm of uncertain behavior of the parotid salivary glands: Secondary | ICD-10-CM | POA: Insufficient documentation

## 2021-07-01 DIAGNOSIS — Z87891 Personal history of nicotine dependence: Secondary | ICD-10-CM | POA: Diagnosis not present

## 2021-07-01 DIAGNOSIS — Z7951 Long term (current) use of inhaled steroids: Secondary | ICD-10-CM | POA: Insufficient documentation

## 2021-07-01 DIAGNOSIS — D649 Anemia, unspecified: Secondary | ICD-10-CM | POA: Insufficient documentation

## 2021-07-01 DIAGNOSIS — Z01818 Encounter for other preprocedural examination: Secondary | ICD-10-CM | POA: Insufficient documentation

## 2021-07-01 HISTORY — DX: Nausea with vomiting, unspecified: Z98.890

## 2021-07-01 HISTORY — DX: Other complications of anesthesia, initial encounter: T88.59XA

## 2021-07-01 HISTORY — DX: Other specified postprocedural states: R11.2

## 2021-07-01 LAB — CBC
HCT: 41 % (ref 36.0–46.0)
Hemoglobin: 13.7 g/dL (ref 12.0–15.0)
MCH: 30.6 pg (ref 26.0–34.0)
MCHC: 33.4 g/dL (ref 30.0–36.0)
MCV: 91.7 fL (ref 80.0–100.0)
Platelets: 216 K/uL (ref 150–400)
RBC: 4.47 MIL/uL (ref 3.87–5.11)
RDW: 13.5 % (ref 11.5–15.5)
WBC: 8.4 K/uL (ref 4.0–10.5)
nRBC: 0 % (ref 0.0–0.2)

## 2021-07-01 LAB — COMPREHENSIVE METABOLIC PANEL WITH GFR
ALT: 25 U/L (ref 0–44)
AST: 27 U/L (ref 15–41)
Albumin: 4.5 g/dL (ref 3.5–5.0)
Alkaline Phosphatase: 76 U/L (ref 38–126)
Anion gap: 15 (ref 5–15)
BUN: 20 mg/dL (ref 8–23)
CO2: 21 mmol/L — ABNORMAL LOW (ref 22–32)
Calcium: 10.1 mg/dL (ref 8.9–10.3)
Chloride: 107 mmol/L (ref 98–111)
Creatinine, Ser: 0.79 mg/dL (ref 0.44–1.00)
GFR, Estimated: >60 mL/min
Glucose, Bld: 164 mg/dL — ABNORMAL HIGH (ref 70–99)
Potassium: 3.6 mmol/L (ref 3.5–5.1)
Sodium: 143 mmol/L (ref 135–145)
Total Bilirubin: 0.4 mg/dL (ref 0.3–1.2)
Total Protein: 7 g/dL (ref 6.5–8.1)

## 2021-07-01 NOTE — Anesthesia Preprocedure Evaluation (Addendum)
Anesthesia Evaluation  Patient identified by MRN, date of birth, ID band Patient awake    Reviewed: Allergy & Precautions, H&P , NPO status , Patient's Chart, lab work & pertinent test results  History of Anesthesia Complications (+) PONV and history of anesthetic complications  Airway Mallampati: III  TM Distance: >3 FB Neck ROM: Full    Dental no notable dental hx. (+) Teeth Intact, Dental Advisory Given   Pulmonary asthma , former smoker,    Pulmonary exam normal breath sounds clear to auscultation       Cardiovascular negative cardio ROS   Rhythm:Regular Rate:Normal     Neuro/Psych  Headaches, PSYCHIATRIC DISORDERS Depression    GI/Hepatic Neg liver ROS, GERD  Medicated,  Endo/Other  negative endocrine ROS  Renal/GU negative Renal ROS  negative genitourinary   Musculoskeletal negative musculoskeletal ROS (+)   Abdominal   Peds  Hematology  (+) Blood dyscrasia, anemia ,   Anesthesia Other Findings   Reproductive/Obstetrics negative OB ROS                           Anesthesia Physical Anesthesia Plan  ASA: 2  Anesthesia Plan: General   Post-op Pain Management: Celebrex PO (pre-op)* and Tylenol PO (pre-op)*   Induction: Intravenous  PONV Risk Score and Plan: 4 or greater and Ondansetron, Dexamethasone, Midazolam, Scopolamine patch - Pre-op, TIVA and Propofol infusion  Airway Management Planned: Oral ETT  Additional Equipment:   Intra-op Plan:   Post-operative Plan: Extubation in OR  Informed Consent: I have reviewed the patients History and Physical, chart, labs and discussed the procedure including the risks, benefits and alternatives for the proposed anesthesia with the patient or authorized representative who has indicated his/her understanding and acceptance.     Dental advisory given  Plan Discussed with: Anesthesiologist and CRNA  Anesthesia Plan Comments: (See  PAT note written 07/01/2021 by Myra Gianotti, PA-C regarding post-operative N/V history. )      Anesthesia Quick Evaluation
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
## 2021-07-01 NOTE — Progress Notes (Signed)
PCP - [name], NP Cardiologist - denies  PPM/ICD - denies Device Orders - n/a Rep Notified - n/a  Chest x-ray - 04/10/2021 EKG - 03/27/2021 - tracing requested  Stress Test - 2009 - negative per patient ECHO - not sure if she had one in 2009 Cardiac Cath - denies  Sleep Study - negative for OSA CPAP - n/a  Fasting Blood Sugar - n/a  Blood Thinner Instructions: n/a  Aspirin Instructions: Patient was instructed: As of today, STOP taking any Aspirin (unless otherwise instructed by your surgeon) Aleve, Naproxen, Ibuprofen, Motrin, Advil, Goody's, BC's, all herbal medications, fish oil, and all vitamins.    ERAS Protcol - yes, until 09:00 o'clock PRE-SURGERY Ensure or G2-   COVID TEST- n/a   Anesthesia review: patient was saw in PAT by Myra Gianotti, PA-C  Patient denies shortness of breath, fever, cough and chest pain at PAT appointment   All instructions explained to the patient, with a verbal understanding of the material. Patient agrees to go over the instructions while at home for a better understanding. Patient also instructed to self quarantine after being tested for COVID-19. The opportunity to ask questions was provided.
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
## 2021-07-02 DIAGNOSIS — Z9049 Acquired absence of other specified parts of digestive tract: Secondary | ICD-10-CM | POA: Insufficient documentation

## 2021-07-05 NOTE — Progress Notes (Signed)
Patient was called and notified that her surgery time on Monday was changed from 12:00 o'clock to 13:11 o'clock and she must be at the hospital at 11:00 o'clock. Patient verbalized understanding.

## 2021-07-08 ENCOUNTER — Observation Stay (HOSPITAL_COMMUNITY)
Admission: RE | Admit: 2021-07-08 | Discharge: 2021-07-09 | Disposition: A | Discharge status: Home / Self Care | Payer: Medicare HMO | Attending: Otolaryngology | Admitting: Otolaryngology

## 2021-07-08 ENCOUNTER — Ambulatory Visit (HOSPITAL_COMMUNITY): Payer: Medicare HMO | Admitting: Physician Assistant

## 2021-07-08 ENCOUNTER — Ambulatory Visit (HOSPITAL_BASED_OUTPATIENT_CLINIC_OR_DEPARTMENT_OTHER): Payer: Medicare HMO | Admitting: Anesthesiology

## 2021-07-08 ENCOUNTER — Encounter (HOSPITAL_COMMUNITY)
Admission: RE | Disposition: A | Discharge status: Home / Self Care | Payer: Self-pay | Source: Home / Self Care | Attending: Otolaryngology

## 2021-07-08 ENCOUNTER — Encounter (HOSPITAL_COMMUNITY): Payer: Self-pay | Admitting: Otolaryngology

## 2021-07-08 ENCOUNTER — Other Ambulatory Visit: Payer: Self-pay

## 2021-07-08 DIAGNOSIS — D351 Benign neoplasm of parathyroid gland: Principal | ICD-10-CM | POA: Insufficient documentation

## 2021-07-08 DIAGNOSIS — Z79899 Other long term (current) drug therapy: Secondary | ICD-10-CM | POA: Insufficient documentation

## 2021-07-08 DIAGNOSIS — Z87891 Personal history of nicotine dependence: Secondary | ICD-10-CM | POA: Insufficient documentation

## 2021-07-08 DIAGNOSIS — Z01818 Encounter for other preprocedural examination: Secondary | ICD-10-CM

## 2021-07-08 DIAGNOSIS — K118 Other diseases of salivary glands: Secondary | ICD-10-CM | POA: Diagnosis present

## 2021-07-08 HISTORY — PX: PAROTIDECTOMY: SHX2163

## 2021-07-08 LAB — GLUCOSE, CAPILLARY: Glucose-Capillary: 130 mg/dL — ABNORMAL HIGH (ref 70–99)

## 2021-07-08 SURGERY — EXCISION, PAROTID GLAND
Anesthesia: General | Site: Face | Laterality: Right

## 2021-07-08 MED ORDER — ORAL CARE MOUTH RINSE: 15.0000 mL | Freq: Once | OROMUCOSAL | Status: AC

## 2021-07-08 MED ORDER — ASCORBIC ACID 500 MG PO TABS
1000.0000 mg | ORAL_TABLET | Freq: Every day | ORAL | Status: DC
  Administered 2021-07-09: 1000 mg via ORAL
  Filled 2021-07-08 (×2): qty 2

## 2021-07-08 MED ORDER — VANCOMYCIN HCL 1250 MG/250ML IV SOLN
1250.0000 mg | INTRAVENOUS | Status: AC
  Administered 2021-07-08: 1250 mg via INTRAVENOUS
  Filled 2021-07-08: qty 250

## 2021-07-08 MED ORDER — TURMERIC 500 MG PO CAPS: ORAL_CAPSULE | Freq: Every day | ORAL | Status: DC

## 2021-07-08 MED ORDER — PROPOFOL 1000 MG/100ML IV EMUL
INTRAVENOUS | Status: AC
  Filled 2021-07-08: qty 100

## 2021-07-08 MED ORDER — HYDRALAZINE HCL 20 MG/ML IJ SOLN
INTRAMUSCULAR | Status: DC | PRN
  Administered 2021-07-08: 5 mg via INTRAVENOUS

## 2021-07-08 MED ORDER — ONDANSETRON HCL 4 MG/2ML IJ SOLN
INTRAMUSCULAR | Status: AC
Start: 2021-07-08 — End: ?
  Filled 2021-07-08: qty 2

## 2021-07-08 MED ORDER — POLYVINYL ALCOHOL 1.4 % OP SOLN: 1.0000 [drp] | Freq: Every day | OPHTHALMIC | Status: DC | PRN

## 2021-07-08 MED ORDER — OMEGA-3-ACID ETHYL ESTERS 1 G PO CAPS
2.0000 | ORAL_CAPSULE | Freq: Two times a day (BID) | ORAL | Status: DC
  Administered 2021-07-08 – 2021-07-09 (×2): 2 g via ORAL
  Filled 2021-07-08 (×2): qty 2

## 2021-07-08 MED ORDER — ARTIFICIAL TEARS OPHTHALMIC OINT
TOPICAL_OINTMENT | OPHTHALMIC | Status: AC
  Filled 2021-07-08: qty 3.5

## 2021-07-08 MED ORDER — FENTANYL CITRATE (PF) 250 MCG/5ML IJ SOLN
INTRAMUSCULAR | Status: AC
  Filled 2021-07-08: qty 5

## 2021-07-08 MED ORDER — MAGNESIUM 200 MG PO TABS: ORAL_TABLET | Freq: Every day | ORAL | Status: DC

## 2021-07-08 MED ORDER — LIDOCAINE 2% (20 MG/ML) 5 ML SYRINGE
INTRAMUSCULAR | Status: AC
  Filled 2021-07-08: qty 5

## 2021-07-08 MED ORDER — DIPHENHYDRAMINE HCL 50 MG/ML IJ SOLN
INTRAMUSCULAR | Status: DC | PRN
  Administered 2021-07-08: 12.5 mg via INTRAVENOUS

## 2021-07-08 MED ORDER — ZINC SULFATE 220 (50 ZN) MG PO CAPS
220.0000 mg | ORAL_CAPSULE | Freq: Every day | ORAL | Status: DC
  Filled 2021-07-08 (×2): qty 1

## 2021-07-08 MED ORDER — DEXMEDETOMIDINE (PRECEDEX) IN NS 20 MCG/5ML (4 MCG/ML) IV SYRINGE
PREFILLED_SYRINGE | INTRAVENOUS | Status: DC | PRN
  Administered 2021-07-08: 8 ug via INTRAVENOUS
  Administered 2021-07-08: 4 ug via INTRAVENOUS
  Administered 2021-07-08: 8 ug via INTRAVENOUS

## 2021-07-08 MED ORDER — PROPOFOL 500 MG/50ML IV EMUL
INTRAVENOUS | Status: DC | PRN
  Administered 2021-07-08: 150 ug/kg/min via INTRAVENOUS

## 2021-07-08 MED ORDER — VITAMIN E 45 MG (100 UNIT) PO CAPS
400.0000 [IU] | ORAL_CAPSULE | Freq: Every day | ORAL | Status: DC
  Administered 2021-07-09: 400 [IU] via ORAL
  Filled 2021-07-08 (×2): qty 4

## 2021-07-08 MED ORDER — DEXAMETHASONE SODIUM PHOSPHATE 10 MG/ML IJ SOLN
INTRAMUSCULAR | Status: AC
Start: 2021-07-08 — End: ?
  Filled 2021-07-08: qty 1

## 2021-07-08 MED ORDER — ADULT MULTIVITAMIN W/MINERALS CH
1.0000 | ORAL_TABLET | Freq: Every day | ORAL | Status: DC
  Administered 2021-07-09: 1 via ORAL
  Filled 2021-07-08 (×2): qty 1

## 2021-07-08 MED ORDER — 0.9 % SODIUM CHLORIDE (POUR BTL) OPTIME
TOPICAL | Status: DC | PRN
  Administered 2021-07-08: 1000 mL

## 2021-07-08 MED ORDER — FENTANYL CITRATE (PF) 100 MCG/2ML IJ SOLN
25.0000 ug | INTRAMUSCULAR | Status: DC | PRN
  Administered 2021-07-08: 50 ug via INTRAVENOUS

## 2021-07-08 MED ORDER — CARBOXYMETHYLCELLULOSE SODIUM 0.5 % OP SOLN
1.0000 [drp] | Freq: Every day | OPHTHALMIC | Status: DC | PRN
Start: 2021-07-08 — End: 2021-07-08

## 2021-07-08 MED ORDER — VITAMIN B-12 1000 MCG PO TABS
1000.0000 ug | ORAL_TABLET | Freq: Every day | ORAL | Status: DC
  Administered 2021-07-09: 1000 ug via ORAL
  Filled 2021-07-08 (×2): qty 1

## 2021-07-08 MED ORDER — PROPOFOL 10 MG/ML IV BOLUS
INTRAVENOUS | Status: DC | PRN
  Administered 2021-07-08: 20 mg via INTRAVENOUS
  Administered 2021-07-08: 100 mg via INTRAVENOUS

## 2021-07-08 MED ORDER — LACTATED RINGERS IV SOLN: INTRAVENOUS | Status: DC

## 2021-07-08 MED ORDER — DEXMEDETOMIDINE HCL IN NACL 80 MCG/20ML IV SOLN
INTRAVENOUS | Status: AC
  Filled 2021-07-08: qty 20

## 2021-07-08 MED ORDER — DEXTROSE-NACL 5-0.9 % IV SOLN
INTRAVENOUS | Status: DC
Start: 2021-07-08 — End: 2021-07-09

## 2021-07-08 MED ORDER — ONDANSETRON HCL 4 MG PO TABS: 4.0000 mg | ORAL_TABLET | ORAL | Status: DC | PRN

## 2021-07-08 MED ORDER — ARTIFICIAL TEARS OPHTHALMIC OINT: TOPICAL_OINTMENT | OPHTHALMIC | Status: DC | PRN

## 2021-07-08 MED ORDER — PANTOPRAZOLE SODIUM 40 MG PO TBEC
40.0000 mg | DELAYED_RELEASE_TABLET | Freq: Every day | ORAL | Status: DC
  Administered 2021-07-09: 40 mg via ORAL
  Filled 2021-07-08: qty 1

## 2021-07-08 MED ORDER — PROPOFOL 10 MG/ML IV BOLUS
INTRAVENOUS | Status: AC
  Filled 2021-07-08: qty 20

## 2021-07-08 MED ORDER — HYDROCODONE-ACETAMINOPHEN 5-325 MG PO TABS: 1.0000 | ORAL_TABLET | ORAL | Status: DC | PRN

## 2021-07-08 MED ORDER — CELECOXIB 200 MG PO CAPS
200.0000 mg | ORAL_CAPSULE | Freq: Once | ORAL | Status: AC
  Administered 2021-07-08: 200 mg via ORAL
  Filled 2021-07-08: qty 1

## 2021-07-08 MED ORDER — AMISULPRIDE (ANTIEMETIC) 5 MG/2ML IV SOLN
10.0000 mg | Freq: Once | INTRAVENOUS | Status: DC | PRN
Start: 2021-07-08 — End: 2021-07-08

## 2021-07-08 MED ORDER — MIDAZOLAM HCL 2 MG/2ML IJ SOLN
INTRAMUSCULAR | Status: DC | PRN
  Administered 2021-07-08: 2 mg via INTRAVENOUS

## 2021-07-08 MED ORDER — VITAMIN D 25 MCG (1000 UNIT) PO TABS
1000.0000 [IU] | ORAL_TABLET | Freq: Every day | ORAL | Status: DC
  Administered 2021-07-09: 1000 [IU] via ORAL
  Filled 2021-07-08 (×2): qty 1

## 2021-07-08 MED ORDER — DIPHENHYDRAMINE HCL 50 MG/ML IJ SOLN
INTRAMUSCULAR | Status: AC
  Filled 2021-07-08: qty 1

## 2021-07-08 MED ORDER — DEXAMETHASONE SODIUM PHOSPHATE 10 MG/ML IJ SOLN
INTRAMUSCULAR | Status: DC | PRN
  Administered 2021-07-08: 5 mg via INTRAVENOUS

## 2021-07-08 MED ORDER — DOXYLAMINE SUCCINATE (SLEEP) 25 MG PO TABS
25.0000 mg | ORAL_TABLET | Freq: Every day | ORAL | Status: DC
  Administered 2021-07-08: 25 mg via ORAL
  Filled 2021-07-08: qty 1

## 2021-07-08 MED ORDER — LIDOCAINE 2% (20 MG/ML) 5 ML SYRINGE
INTRAMUSCULAR | Status: DC | PRN
  Administered 2021-07-08: 60 mg via INTRAVENOUS

## 2021-07-08 MED ORDER — LIDOCAINE-EPINEPHRINE 1 %-1:100000 IJ SOLN
INTRAMUSCULAR | Status: AC
  Filled 2021-07-08: qty 1

## 2021-07-08 MED ORDER — MOMETASONE FURO-FORMOTEROL FUM 100-5 MCG/ACT IN AERO
2.0000 | INHALATION_SPRAY | Freq: Two times a day (BID) | RESPIRATORY_TRACT | Status: DC
  Filled 2021-07-08: qty 8.8

## 2021-07-08 MED ORDER — HYDRALAZINE HCL 20 MG/ML IJ SOLN
INTRAMUSCULAR | Status: AC
Start: 2021-07-08 — End: ?
  Filled 2021-07-08: qty 1

## 2021-07-08 MED ORDER — SUCCINYLCHOLINE CHLORIDE 200 MG/10ML IV SOSY
PREFILLED_SYRINGE | INTRAVENOUS | Status: DC | PRN
  Administered 2021-07-08: 120 mg via INTRAVENOUS

## 2021-07-08 MED ORDER — ONDANSETRON HCL 4 MG/2ML IJ SOLN: 4.0000 mg | INTRAMUSCULAR | Status: DC | PRN

## 2021-07-08 MED ORDER — FENTANYL CITRATE (PF) 250 MCG/5ML IJ SOLN
INTRAMUSCULAR | Status: DC | PRN
  Administered 2021-07-08 (×5): 50 ug via INTRAVENOUS

## 2021-07-08 MED ORDER — ONDANSETRON HCL 4 MG/2ML IJ SOLN
INTRAMUSCULAR | Status: DC | PRN
  Administered 2021-07-08: 4 mg via INTRAVENOUS

## 2021-07-08 MED ORDER — ALPRAZOLAM 0.25 MG PO TABS
0.5000 mg | ORAL_TABLET | Freq: Three times a day (TID) | ORAL | Status: DC | PRN
Start: 2021-07-08 — End: 2021-07-09

## 2021-07-08 MED ORDER — PROMETHAZINE HCL 25 MG/ML IJ SOLN: 6.2500 mg | INTRAMUSCULAR | Status: DC | PRN

## 2021-07-08 MED ORDER — CHLORHEXIDINE GLUCONATE 0.12 % MT SOLN
15.0000 mL | Freq: Once | OROMUCOSAL | Status: AC
  Administered 2021-07-08: 15 mL via OROMUCOSAL
  Filled 2021-07-08: qty 15

## 2021-07-08 MED ORDER — FERROUS SULFATE 325 (65 FE) MG PO TABS
325.0000 mg | ORAL_TABLET | Freq: Every day | ORAL | Status: DC
  Administered 2021-07-09: 325 mg via ORAL
  Filled 2021-07-08: qty 1

## 2021-07-08 MED ORDER — FLUOXETINE HCL 20 MG PO CAPS
20.0000 mg | ORAL_CAPSULE | Freq: Every day | ORAL | Status: DC
  Administered 2021-07-09: 20 mg via ORAL
  Filled 2021-07-08: qty 1

## 2021-07-08 MED ORDER — MIDAZOLAM HCL 2 MG/2ML IJ SOLN
INTRAMUSCULAR | Status: AC
Start: 2021-07-08 — End: ?
  Filled 2021-07-08: qty 2

## 2021-07-08 MED ORDER — FENTANYL CITRATE (PF) 100 MCG/2ML IJ SOLN
INTRAMUSCULAR | Status: AC
  Filled 2021-07-08: qty 2

## 2021-07-08 MED ORDER — SCOPOLAMINE 1 MG/3DAYS TD PT72
1.0000 | MEDICATED_PATCH | TRANSDERMAL | Status: DC
  Administered 2021-07-08: 1.5 mg via TRANSDERMAL
  Filled 2021-07-08: qty 1

## 2021-07-08 MED ORDER — IBUPROFEN 100 MG/5ML PO SUSP
400.0000 mg | Freq: Four times a day (QID) | ORAL | Status: DC | PRN
  Administered 2021-07-08: 400 mg via ORAL
  Filled 2021-07-08: qty 20

## 2021-07-08 MED ORDER — ROSUVASTATIN CALCIUM 5 MG PO TABS
10.0000 mg | ORAL_TABLET | Freq: Every day | ORAL | Status: DC
  Administered 2021-07-09: 10 mg via ORAL
  Filled 2021-07-08: qty 2

## 2021-07-08 MED ORDER — BACITRACIN ZINC 500 UNIT/GM EX OINT
TOPICAL_OINTMENT | CUTANEOUS | Status: AC
Start: 2021-07-08 — End: ?
  Filled 2021-07-08: qty 28.35

## 2021-07-08 MED ORDER — ACETAMINOPHEN 500 MG PO TABS
1000.0000 mg | ORAL_TABLET | Freq: Once | ORAL | Status: AC
  Administered 2021-07-08: 1000 mg via ORAL
  Filled 2021-07-08: qty 2

## 2021-07-08 SURGICAL SUPPLY — 45 items
ATTRACTOMAT 16X20 MAGNETIC DRP (DRAPES) ×1 IMPLANT
BAG COUNTER SPONGE SURGICOUNT (BAG) ×2 IMPLANT
BIOPATCH RED 1 DISK 7.0 (GAUZE/BANDAGES/DRESSINGS) ×1 IMPLANT
BLADE SURG 15 STRL LF DISP TIS (BLADE) ×1 IMPLANT
BLADE SURG 15 STRL SS (BLADE) ×2
CANISTER SUCT 3000ML PPV (MISCELLANEOUS) ×2 IMPLANT
CLEANER TIP ELECTROSURG 2X2 (MISCELLANEOUS) ×2 IMPLANT
CNTNR URN SCR LID CUP LEK RST (MISCELLANEOUS) IMPLANT
CONT SPEC 4OZ STRL OR WHT (MISCELLANEOUS) ×2
CORD BIPOLAR FORCEPS 12FT (ELECTRODE) ×2 IMPLANT
COVER SURGICAL LIGHT HANDLE (MISCELLANEOUS) ×2 IMPLANT
DERMABOND ADVANCED (GAUZE/BANDAGES/DRESSINGS) ×1
DERMABOND ADVANCED .7 DNX12 (GAUZE/BANDAGES/DRESSINGS) ×1 IMPLANT
DRAIN JACKSON RD 7FR 3/32 (WOUND CARE) ×1 IMPLANT
DRAPE INCISE 23X17 IOBAN STRL (DRAPES) ×1
DRAPE INCISE 23X17 STRL (DRAPES) ×1 IMPLANT
DRAPE INCISE IOBAN 23X17 STRL (DRAPES) ×1 IMPLANT
DRSG TEGADERM 4X4.75 (GAUZE/BANDAGES/DRESSINGS) ×1 IMPLANT
ELECT COATED BLADE 2.86 ST (ELECTRODE) ×2 IMPLANT
ELECT REM PT RETURN 9FT ADLT (ELECTROSURGICAL) ×2
ELECTRODE REM PT RTRN 9FT ADLT (ELECTROSURGICAL) ×1 IMPLANT
EVACUATOR SILICONE 100CC (DRAIN) ×1 IMPLANT
FORCEPS BIPOLAR SPETZLER 8 1.0 (NEUROSURGERY SUPPLIES) ×2 IMPLANT
GAUZE 4X4 16PLY ~~LOC~~+RFID DBL (SPONGE) ×2 IMPLANT
GLOVE ECLIPSE 7.5 STRL STRAW (GLOVE) ×2 IMPLANT
GOWN STRL REUS W/ TWL LRG LVL3 (GOWN DISPOSABLE) ×2 IMPLANT
GOWN STRL REUS W/TWL LRG LVL3 (GOWN DISPOSABLE) ×4
KIT BASIN OR (CUSTOM PROCEDURE TRAY) ×2 IMPLANT
KIT TURNOVER KIT B (KITS) ×2 IMPLANT
LOCATOR NERVE 3 VOLT (DISPOSABLE) ×1 IMPLANT
NS IRRIG 1000ML POUR BTL (IV SOLUTION) ×2 IMPLANT
PAD ARMBOARD 7.5X6 YLW CONV (MISCELLANEOUS) ×4 IMPLANT
PENCIL FOOT CONTROL (ELECTRODE) ×2 IMPLANT
SHEARS HARMONIC 9CM CVD (BLADE) ×2 IMPLANT
STAPLER VISISTAT 35W (STAPLE) ×2 IMPLANT
SUT CHROMIC 3 0 SH 27 (SUTURE) ×2 IMPLANT
SUT CHROMIC 4 0 PS 2 18 (SUTURE) ×2 IMPLANT
SUT ETHILON 2 0 FS 18 (SUTURE) ×1 IMPLANT
SUT ETHILON 5 0 P 3 18 (SUTURE) ×2
SUT NYLON ETHILON 5-0 P-3 1X18 (SUTURE) IMPLANT
SUT SILK 2 0 SH CR/8 (SUTURE) ×2 IMPLANT
SUT SILK 4 0 REEL (SUTURE) ×2 IMPLANT
SUT VIC AB 3-0 SH 18 (SUTURE) ×2 IMPLANT
TOWEL GREEN STERILE FF (TOWEL DISPOSABLE) ×2 IMPLANT
TRAY ENT MC OR (CUSTOM PROCEDURE TRAY) ×2 IMPLANT

## 2021-07-08 NOTE — Interval H&P Note (Signed)
History and Physical Interval Note:  07/08/2021 11:01 AM  Monica Barnes  has presented today for surgery, with the diagnosis of Parotid mass.  The various methods of treatment have been discussed with the patient and family. After consideration of risks, benefits and other options for treatment, the patient has consented to  Procedure(s): PAROTIDECTOMY WITH FACIAL NERVE DISSECTION (Right) as a surgical intervention.  The patient's history has been reviewed, patient examined, no change in status, stable for surgery.  I have reviewed the patient's chart and labs.  Questions were answered to the patient's satisfaction.     Izora Gala

## 2021-07-08 NOTE — Progress Notes (Signed)
Postop check  Awake and alert, taking food and liquid by mouth very nicely.  Not much pain.  Drain is functioning.  Incision looks excellent.  Facial nerve function severely diminished.  Stable postop.  Eye care.  Anticipate discharge in the morning.

## 2021-07-08 NOTE — Anesthesia Procedure Notes (Signed)
Procedure Name: Intubation Date/Time: 07/08/2021 1:09 PM  Performed by: Alain Marion, CRNAPre-anesthesia Checklist: Patient identified, Emergency Drugs available, Suction available and Patient being monitored Patient Re-evaluated:Patient Re-evaluated prior to induction Oxygen Delivery Method: Circle System Utilized Preoxygenation: Pre-oxygenation with 100% oxygen Induction Type: IV induction Ventilation: Mask ventilation without difficulty Laryngoscope Size: Glidescope and 3 Grade View: Grade I Tube type: Oral Tube size: 7.0 mm Number of attempts: 1 Airway Equipment and Method: Stylet, Oral airway and Video-laryngoscopy Placement Confirmation: ETT inserted through vocal cords under direct vision, positive ETCO2 and breath sounds checked- equal and bilateral Secured at: 21 cm Tube secured with: Tape Dental Injury: Teeth and Oropharynx as per pre-operative assessment  Comments: Grade 3 view with Miller 2, unable to easily pass ETT, elective Glide use for intubation.  No complications.

## 2021-07-08 NOTE — Anesthesia Postprocedure Evaluation (Signed)
Anesthesia Post Note  Patient: Monica Barnes  Procedure(s) Performed: PAROTIDECTOMY WITH FACIAL NERVE DISSECTION (Right: Face)     Patient location during evaluation: PACU Anesthesia Type: General Level of consciousness: awake and alert Pain management: pain level controlled Vital Signs Assessment: post-procedure vital signs reviewed and stable Respiratory status: spontaneous breathing, nonlabored ventilation, respiratory function stable and patient connected to nasal cannula oxygen Cardiovascular status: blood pressure returned to baseline and stable Postop Assessment: no apparent nausea or vomiting Anesthetic complications: no   No notable events documented.  Last Vitals:  Vitals:   07/08/21 1507 07/08/21 1522  BP: (!) 110/48 110/60  Pulse: 77 72  Resp: (!) 22 14  Temp:    SpO2: 94% 97%    Last Pain:  Vitals:   07/08/21 1522  TempSrc:   PainSc: 3                  Niobe Dick,W. EDMOND

## 2021-07-08 NOTE — Op Note (Addendum)
OPERATIVE REPORT  DATE OF SURGERY: 07/08/2021  PATIENT:  Monica Barnes,  66 y.o. female  PRE-OPERATIVE DIAGNOSIS:  Parotid mass  POST-OPERATIVE DIAGNOSIS:  Parotid mass  PROCEDURE:  Procedure(s): PAROTIDECTOMY WITH FACIAL NERVE DISSECTION  SURGEON:  Beckie Salts, MD  ASSISTANTS: Pietro Cassis PA  ANESTHESIA:   General   EBL: 50 ml  DRAINS: 7 French round JP  LOCAL MEDICATIONS USED:  None  SPECIMEN: Right parotid  COUNTS:  Correct  PROCEDURE DETAILS: The patient was taken to the operating room and placed on the operating table in the supine position. Following induction of general endotracheal anesthesia, the right side of the face was prepped and draped in a standard fashion. A preauricular incision was outlined with a marking pen with extension down around the mastoid process and 2 fingerbreadths below the angle of the mandible.  Electrocautery was used to incise the skin and subcutaneous tissue. The lateral branch of the greater auricular nerve was preserved. The parotid gland was dissected forward off of the upper sternocleidomastoid muscle and the digastric muscle was exposed posteriorly. The gland was also dissected off of the external auditory canal. Careful dissection superior to the digastric muscle and medial to the tympanomastoid suture line revealed the main trunk of the facial nerve. Using a McCabe dissector the main trunk was dissected out towards the pes anserinus. The upper and lower divisions were then dissected identifying all branches in the lower division but there was difficulty identifying the upper division branches due to the proximity of the tumor.  The harmonic scalpel was used to divide the parotid tissue. Bipolar cautery was used as needed for completion of hemostasis. The tumor was identified in the superior part of the gland. The glandular tissue with the accompanying tumor was dissected free and removed, sent for pathologic evaluation. The wound was  irrigated with saline and hemostasis was completed as needed.  The drain was exited through a separate stab incision and secured in place with nylon suture. A running subcuticular chromic closure was accomplished and Dermabond was used on the skin. The drain was charged. She was awakened, extubated and transferred to recovery in stable condition.  Surgical assistant was necessary in this case for retraction, identification of vessels and for observing for facial movements.  PATIENT DISPOSITION:  To PACU, stable

## 2021-07-08 NOTE — Transfer of Care (Signed)
Immediate Anesthesia Transfer of Care Note  Patient: Monica Barnes  Procedure(s) Performed: PAROTIDECTOMY WITH FACIAL NERVE DISSECTION (Right: Face)  Patient Location: PACU  Anesthesia Type:General  Level of Consciousness: awake and alert   Airway & Oxygen Therapy: Patient Spontanous Breathing and Patient connected to face mask oxygen  Post-op Assessment: Report given to RN and Post -op Vital signs reviewed and stable  Post vital signs: Reviewed and stable  Last Vitals:  Vitals Value Taken Time  BP 104/52 07/08/21 1453  Temp 36.3 C 07/08/21 1452  Pulse 87 07/08/21 1455  Resp 11 07/08/21 1455  SpO2 94 % 07/08/21 1455  Vitals shown include unvalidated device data.  Last Pain:  Vitals:   07/08/21 1452  TempSrc:   PainSc: 0-No pain         Complications: No notable events documented.

## 2021-07-09 ENCOUNTER — Encounter (HOSPITAL_COMMUNITY): Payer: Self-pay | Admitting: Otolaryngology

## 2021-07-09 DIAGNOSIS — D351 Benign neoplasm of parathyroid gland: Secondary | ICD-10-CM | POA: Diagnosis not present

## 2021-07-09 NOTE — Discharge Summary (Signed)
Physician Discharge Summary  Patient ID: Monica Barnes MRN: [id number] DOB/AGE: August 22, 1955 66 y.o.  Admit date: 07/08/2021 Discharge date: 07/09/2021  Admission Diagnoses: Parotid mass  Discharge Diagnoses:  Principal Problem:   Parotid mass   Discharged Condition: good  Hospital Course: No complications  Consults: none  Significant Diagnostic Studies: none  Treatments: surgery: Superficial parotidectomy with facial nerve dissection  Discharge Exam: Blood pressure 126/79, pulse 77, temperature 98.3 F (36.8 C), temperature source Oral, resp. rate 17, height 5' 1"  (1.549 m), weight 81.6 kg, last menstrual period 01/27/2009, SpO2 97 %. PHYSICAL EXAM: Incision looks excellent.  Drain in place.  Facial nerve weakness.  Disposition: Discharge disposition: 01-Home or Self Care       Discharge Instructions     Diet - low sodium heart healthy   Complete by: As directed    Discharge wound care:   Complete by: As directed    Empty and recharge drain 3 times daily.   Increase activity slowly   Complete by: As directed       Allergies as of 07/09/2021       Reactions   Penicillins Nausea And Vomiting   Latex Swelling   Lips swelling during dental procedure        Medication List     TAKE these medications    ALPRAZolam 0.5 MG tablet Commonly known as: XANAX Take 0.5 mg by mouth 3 (three) times daily as needed for anxiety.   B-12 SL Place 1 tablet under the tongue daily.   budesonide-formoterol 80-4.5 MCG/ACT inhaler Commonly known as: Symbicort Inhale 2 puffs into the lungs 2 (two) times daily.   carboxymethylcellulose 0.5 % Soln Commonly known as: REFRESH PLUS Place 1 drop into both eyes daily as needed (dry eyes).   cholecalciferol 25 MCG (1000 UNIT) tablet Commonly known as: VITAMIN D3 Take 1,000 Units by mouth daily.   dexlansoprazole 60 MG capsule Commonly known as: DEXILANT Take 1 capsule (60 mg total) by mouth daily.   doxylamine (Sleep)  25 MG tablet Commonly known as: UNISOM Take 25 mg by mouth at bedtime.   ferrous sulfate 325 (65 FE) MG tablet Take 325 mg by mouth daily with breakfast.   FLUoxetine 20 MG capsule Commonly known as: PROZAC TAKE 1 CAPSULE BY MOUTH EVERY DAY   MAGNESIUM PO Take 1 tablet by mouth daily.   multivitamin with minerals Tabs tablet Take 1 tablet by mouth daily.   omega-3 acid ethyl esters 1 g capsule Commonly known as: LOVAZA TAKE 2 CAPSULES BY MOUTH TWICE A DAY   rosuvastatin 10 MG tablet Commonly known as: CRESTOR TAKE 1 TABLET BY MOUTH EVERY DAY   STRESS B COMPLEX PO Take 1 capsule by mouth daily.   TURMERIC PO Take 1 capsule by mouth daily.   vitamin C 1000 MG tablet Take 1,000 mg by mouth daily.   vitamin E 180 MG (400 UNITS) capsule Take 400 Units by mouth daily.   VITAMIN K2-VITAMIN D3 PO Take 1 capsule by mouth daily.   zinc gluconate 50 MG tablet Take 50 mg by mouth daily.               Discharge Care Instructions  (From admission, onward)           Start     Ordered   07/09/21 0000  Discharge wound care:       Comments: Empty and recharge drain 3 times daily.   07/09/21 9767  Follow-up Information     Izora Gala, MD Follow up on 07/11/2021.   Specialty: Otolaryngology Why: Come at 1 PM for drain removal. Contact information: [address] [place] [address] [phone]                 Signed: Izora Gala 07/09/2021, 8:51 AM

## 2021-07-09 NOTE — Progress Notes (Signed)
Discharge instructions given to patient, patient verbalizes understanding. Follow up appointment reviewed, jp instructions provided. Iv removed. Patient discharged

## 2021-07-09 NOTE — Progress Notes (Signed)
Doing well, no complaints.  Minimal pain.  Drain functioning.  Exam unchanged.  Will discharge home with drain.

## 2021-07-09 NOTE — Discharge Instructions (Signed)
Keep drain site clean and dry.  Keep the eye taped closed when sleeping.

## 2021-07-10 ENCOUNTER — Telehealth: Payer: Self-pay | Admitting: *Deleted

## 2021-07-10 LAB — SURGICAL PATHOLOGY

## 2021-07-10 NOTE — Telephone Encounter (Signed)
Transition Care Management Follow-up Telephone Call Date of discharge and from where: 07/09/21 FROM [facility] How have you been since you were released from the hospital? Been fine, doing great Any questions or concerns? No  Items Reviewed: Did the pt receive and understand the discharge instructions provided? Yes  Medications obtained and verified? Yes  Other? No  Any new allergies since your discharge? No  Dietary orders reviewed? Yes Do you have support at home? Yes   Home Care and Equipment/Supplies: Were home health services ordered? no If so, what is the name of the agency? NA  Has the agency set up a time to come to the patient's home? not applicable Were any new equipment or medical supplies ordered?  No What is the name of the medical supply agency? NA Were you able to get the supplies/equipment? not applicable Do you have any questions related to the use of the equipment or supplies? No  Functional Questionnaire: (I = Independent and D = Dependent) ADLs: I  Bathing/Dressing- I  Meal Prep- HUSBAND  DOES FROM PREVIOUS INJURY  Eating- I  Maintaining continence- I  Transferring/Ambulation- I  Managing Meds- I  Follow up appointments reviewed:  PCP Hospital f/u appt confirmed? No  Scheduled to see SURGEON WITHIN 7-10 DAYS.  HAS NOT SEEN PCP IN WHILE ENCOURAGED HUSBAND TO SCHEDULE APPOINTMENT ASAP. [facility] f/u appt confirmed? Yes  Scheduled to see ROSEN on 7/15 @ 1 PM. Are transportation arrangements needed? Yes  If their condition worsens, is the pt aware to call PCP or go to the Emergency Dept.? Yes Was the patient provided with contact information for the PCP's office or ED? Yes Was to pt encouraged to call back with questions or concerns? Yes    Hubert Azure RN, MSN RN Care Management Coordinator  [phone] [email or web address]

## 2021-10-12 ENCOUNTER — Other Ambulatory Visit: Payer: Self-pay | Admitting: Internal Medicine

## 2021-10-12 DIAGNOSIS — E781 Pure hyperglyceridemia: Secondary | ICD-10-CM

## 2022-02-05 ENCOUNTER — Telehealth: Payer: Self-pay | Admitting: Internal Medicine

## 2022-02-05 NOTE — Telephone Encounter (Signed)
Patient called and said that she used to be a patient of Dr Ronnald Ramp and would like to re-establish with him. Is this okay?

## 2022-02-06 NOTE — Telephone Encounter (Signed)
Scheduled

## 2022-05-13 ENCOUNTER — Telehealth: Payer: Self-pay

## 2022-05-13 NOTE — Telephone Encounter (Signed)
Contacted Monica Barnes to schedule their annual wellness visit. Appointment made for 05/19/22.  [name], CMA (AAMA)  [facility] [phone]

## 2022-05-14 ENCOUNTER — Encounter (INDEPENDENT_AMBULATORY_CARE_PROVIDER_SITE_OTHER): Payer: Self-pay

## 2022-05-16 LAB — HM MAMMOGRAPHY: HM Mammogram: NORMAL (ref 0–4)

## 2022-05-19 ENCOUNTER — Encounter: Payer: Self-pay | Admitting: Internal Medicine

## 2022-05-19 ENCOUNTER — Ambulatory Visit (INDEPENDENT_AMBULATORY_CARE_PROVIDER_SITE_OTHER): Payer: Medicare HMO | Admitting: Internal Medicine

## 2022-05-19 VITALS — BP 144/92 | HR 85 | Temp 98.0°F | Ht 61.0 in | Wt 185.0 lb

## 2022-05-19 DIAGNOSIS — K219 Gastro-esophageal reflux disease without esophagitis: Secondary | ICD-10-CM | POA: Diagnosis not present

## 2022-05-19 DIAGNOSIS — Z0001 Encounter for general adult medical examination with abnormal findings: Secondary | ICD-10-CM

## 2022-05-19 DIAGNOSIS — E669 Obesity, unspecified: Secondary | ICD-10-CM

## 2022-05-19 DIAGNOSIS — E781 Pure hyperglyceridemia: Secondary | ICD-10-CM

## 2022-05-19 DIAGNOSIS — Z0000 Encounter for general adult medical examination without abnormal findings: Secondary | ICD-10-CM | POA: Diagnosis not present

## 2022-05-19 DIAGNOSIS — I10 Essential (primary) hypertension: Secondary | ICD-10-CM

## 2022-05-19 DIAGNOSIS — R9431 Abnormal electrocardiogram [ECG] [EKG]: Secondary | ICD-10-CM

## 2022-05-19 DIAGNOSIS — R7303 Prediabetes: Secondary | ICD-10-CM

## 2022-05-19 DIAGNOSIS — F411 Generalized anxiety disorder: Secondary | ICD-10-CM

## 2022-05-19 DIAGNOSIS — E785 Hyperlipidemia, unspecified: Secondary | ICD-10-CM

## 2022-05-19 DIAGNOSIS — E66811 Obesity, class 1: Secondary | ICD-10-CM

## 2022-05-19 DIAGNOSIS — K7581 Nonalcoholic steatohepatitis (NASH): Secondary | ICD-10-CM | POA: Diagnosis not present

## 2022-05-19 DIAGNOSIS — E1169 Type 2 diabetes mellitus with other specified complication: Secondary | ICD-10-CM

## 2022-05-19 LAB — CBC WITH DIFFERENTIAL/PLATELET
Basophils Absolute: 0 10*3/uL (ref 0.0–0.1)
Basophils Relative: 0.5 % (ref 0.0–3.0)
Eosinophils Absolute: 0.1 10*3/uL (ref 0.0–0.7)
Eosinophils Relative: 1.4 % (ref 0.0–5.0)
HCT: 41.7 % (ref 36.0–46.0)
Hemoglobin: 14.2 g/dL (ref 12.0–15.0)
Lymphocytes Relative: 17.9 % (ref 12.0–46.0)
Lymphs Abs: 1.1 10*3/uL (ref 0.7–4.0)
MCHC: 34.1 g/dL (ref 30.0–36.0)
MCV: 96.8 fl (ref 78.0–100.0)
Monocytes Absolute: 0.7 10*3/uL (ref 0.1–1.0)
Monocytes Relative: 11.4 % (ref 3.0–12.0)
Neutro Abs: 4.1 10*3/uL (ref 1.4–7.7)
Neutrophils Relative %: 68.8 % (ref 43.0–77.0)
Platelets: 218 10*3/uL (ref 150.0–400.0)
RBC: 4.31 Mil/uL (ref 3.87–5.11)
RDW: 12.6 % (ref 11.5–15.5)
WBC: 6 10*3/uL (ref 4.0–10.5)

## 2022-05-19 LAB — HEPATIC FUNCTION PANEL
ALT: 70 U/L — ABNORMAL HIGH (ref 0–35)
AST: 62 U/L — ABNORMAL HIGH (ref 0–37)
Albumin: 4.7 g/dL (ref 3.5–5.2)
Alkaline Phosphatase: 78 U/L (ref 39–117)
Bilirubin, Direct: 0.1 mg/dL (ref 0.0–0.3)
Total Bilirubin: 0.4 mg/dL (ref 0.2–1.2)
Total Protein: 7.4 g/dL (ref 6.0–8.3)

## 2022-05-19 LAB — HEMOGLOBIN A1C: Hgb A1c MFr Bld: 6.6 % — ABNORMAL HIGH (ref 4.6–6.5)

## 2022-05-19 LAB — BASIC METABOLIC PANEL
BUN: 19 mg/dL (ref 6–23)
CO2: 27 mEq/L (ref 19–32)
Calcium: 10 mg/dL (ref 8.4–10.5)
Chloride: 98 mEq/L (ref 96–112)
Creatinine, Ser: 0.8 mg/dL (ref 0.40–1.20)
GFR: 75.78 mL/min (ref >60.00)
Glucose, Bld: 121 mg/dL — ABNORMAL HIGH (ref 70–99)
Potassium: 4.4 mEq/L (ref 3.5–5.1)
Sodium: 136 mEq/L (ref 135–145)

## 2022-05-19 LAB — LDL CHOLESTEROL, DIRECT: Direct LDL: 193 mg/dL

## 2022-05-19 LAB — LIPID PANEL
Cholesterol: 283 mg/dL — ABNORMAL HIGH (ref 0–200)
HDL: 61.7 mg/dL (ref >39.00)
NonHDL: 221.17
Total CHOL/HDL Ratio: 5
Triglycerides: 253 mg/dL — ABNORMAL HIGH (ref 0.0–149.0)
VLDL: 50.6 mg/dL — ABNORMAL HIGH (ref 0.0–40.0)

## 2022-05-19 LAB — TSH: TSH: 1.95 u[IU]/mL (ref 0.35–5.50)

## 2022-05-19 MED ORDER — ALPRAZOLAM 0.5 MG PO TABS
0.5000 mg | ORAL_TABLET | Freq: Three times a day (TID) | ORAL | 0 refills | Status: DC | PRN
Start: 2022-05-19 — End: 2022-08-21

## 2022-05-19 NOTE — Patient Instructions (Signed)

## 2022-05-19 NOTE — Progress Notes (Unsigned)
Subjective:  Patient ID: Monica Barnes, female    DOB: 24-Mar-1955  Age: 67 y.o. MRN: [id number]  CC: Annual Exam, Hypertension, and Hyperlipidemia   HPI Monica Barnes presents for f/up ---  She had a major traumatic accident in the last year affecting her right lower leg.  She therefore does not exercise but when she is active she feels a sensation of wheezing and possibly shortness of breath.  She denies chest pain, diaphoresis, or edema.  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  Outpatient Medications Prior to Visit  Medication Sig Dispense Refill   Ascorbic Acid (VITAMIN C) 1000 MG tablet Take 1,000 mg by mouth daily.     B Complex-C-Folic Acid (STRESS B COMPLEX PO) Take 1 capsule by mouth daily.     carboxymethylcellulose (REFRESH PLUS) 0.5 % SOLN Place 1 drop into both eyes daily as needed (dry eyes).     cholecalciferol (VITAMIN D3) 25 MCG (1000 UNIT) tablet Take 1,000 Units by mouth daily.     Cyanocobalamin (B-12 SL) Place 1 tablet under the tongue daily.     dexlansoprazole (DEXILANT) 60 MG capsule Take 1 capsule (60 mg total) by mouth daily. 90 capsule 1   doxylamine, Sleep, (UNISOM) 25 MG tablet Take 25 mg by mouth at bedtime.     ferrous sulfate 325 (65 FE) MG tablet Take 325 mg by mouth daily with breakfast.     FLUoxetine (PROZAC) 20 MG capsule TAKE 1 CAPSULE BY MOUTH EVERY DAY 90 capsule 1   MAGNESIUM PO Take 1 tablet by mouth daily.     Multiple Vitamin (MULTIVITAMIN WITH MINERALS) TABS tablet Take 1 tablet by mouth daily.     omega-3 acid ethyl esters (LOVAZA) 1 g capsule TAKE 2 CAPSULES BY MOUTH TWICE A DAY 360 capsule 1   TURMERIC PO Take 1 capsule by mouth daily.     Vitamin D-Vitamin K (VITAMIN K2-VITAMIN D3 PO) Take 1 capsule by mouth daily.     vitamin E 180 MG (400 UNITS) capsule Take 400 Units by mouth daily.     zinc gluconate 50 MG tablet Take 50 mg by mouth daily.     ALPRAZolam (XANAX) 0.5 MG  tablet Take 0.5 mg by mouth 3 (three) times daily as needed for anxiety.     budesonide-formoterol (SYMBICORT) 80-4.5 MCG/ACT inhaler Inhale 2 puffs into the lungs 2 (two) times daily. (Patient not taking: Reported on 06/25/2021) 3 each 1   rosuvastatin (CRESTOR) 10 MG tablet TAKE 1 TABLET BY MOUTH EVERY DAY 90 tablet 1   No facility-administered medications prior to visit.    ROS Review of Systems  Constitutional:  Positive for unexpected weight change (wt gain). Negative for appetite change, chills, diaphoresis and fatigue.  HENT: Negative.    Respiratory:  Positive for shortness of breath. Negative for cough, chest tightness and wheezing.   Cardiovascular:  Negative for chest pain, palpitations and leg swelling.  Gastrointestinal:  Negative for abdominal pain, constipation, diarrhea, nausea and vomiting.  Genitourinary: Negative.  Negative for difficulty urinating.  Musculoskeletal:  Positive for arthralgias. Negative for myalgias and neck pain.  Neurological: Negative.  Negative for weakness.  Hematological:  Negative for adenopathy. Does not bruise/bleed easily.  Psychiatric/Behavioral:  Negative for decreased concentration, dysphoric mood and sleep disturbance. The patient is nervous/anxious.     Objective:  BP (!) 144/92 (BP Location: Left Arm, Patient Position: Sitting, Cuff Size: Large)   Pulse 85  Temp 98 F (36.7 C) (Oral)   Ht  (1.549 m)   Wt 185 lb (83.9 kg)   LMP 01/27/2009   SpO2 94%   BMI 34.96 kg/m   BP Readings from Last 3 Encounters:  05/19/22 (!) 144/92  07/09/21 126/79  07/01/21 (!) 168/91    Wt Readings from Last 3 Encounters:  05/19/22 185 lb (83.9 kg)  07/08/21 180 lb (81.6 kg)  07/01/21 180 lb (81.6 kg)    Physical Exam Vitals reviewed.  Constitutional:      Appearance: She is obese. She is not ill-appearing.  HENT:     Nose: Nose normal.     Mouth/Throat:     Mouth: Mucous membranes are moist.  Eyes:     General: No visual field  deficit or scleral icterus.    Conjunctiva/sclera: Conjunctivae normal.  Cardiovascular:     Rate and Rhythm: Normal rate and regular rhythm.     Heart sounds: Normal heart sounds, S1 normal and S2 normal. No murmur heard.    No friction rub. No gallop.     Comments: EKG --- NSR, 80 bpm LVH No Q waves or ST/T wave changes Unchanged Pulmonary:     Effort: Pulmonary effort is normal.     Breath sounds: No stridor. No wheezing, rhonchi or rales.  Abdominal:     General: Abdomen is protuberant. Bowel sounds are normal. There is no distension.     Palpations: Abdomen is soft. There is no hepatomegaly, splenomegaly or mass.     Tenderness: There is no abdominal tenderness. There is no guarding.  Musculoskeletal:        General: Deformity present. No swelling.     Cervical back: Neck supple.     Right lower leg: No edema.     Left lower leg: No edema.  Lymphadenopathy:     Cervical: No cervical adenopathy.  Skin:    General: Skin is warm.  Neurological:     Mental Status: She is alert. Mental status is at baseline.     Cranial Nerves: Cranial nerve deficit and facial asymmetry present. No dysarthria.  Psychiatric:        Attention and Perception: Attention and perception normal.        Mood and Affect: Affect normal. Mood is anxious.        Speech: Speech normal.        Behavior: Behavior normal. Behavior is cooperative.        Thought Content: Thought content normal.        Cognition and Memory: Cognition normal.     Lab Results  Component Value Date   WBC 6.0 05/19/2022   HGB 14.2 05/19/2022   HCT 41.7 05/19/2022   PLT 218.0 05/19/2022   GLUCOSE 121 (H) 05/19/2022   CHOL 283 (H) 05/19/2022   TRIG 253.0 (H) 05/19/2022   HDL 61.70 05/19/2022   LDLDIRECT 193.0 05/19/2022   LDLCALC 66 04/11/2019   ALT 70 (H) 05/19/2022   AST 62 (H) 05/19/2022   NA 136 05/19/2022   K 4.4 05/19/2022   CL 98 05/19/2022   CREATININE 0.80 05/19/2022   BUN 19 05/19/2022   CO2 27 05/19/2022    TSH 1.95 05/19/2022   INR 1.0 05/01/2020   HGBA1C 6.6 (H) 05/19/2022    No results found.  Assessment & Plan:   Hyperlipidemia LDL goal <130- I recommended a statin for cardiovascular risk reduction. -     Lipid panel; Future -  TSH; Future -     Hepatic function panel; Future -     CT CARDIAC SCORING (SELF PAY ONLY); Future -     Rosuvastatin Calcium; Take 1 tablet (20 mg total) by mouth daily.  Dispense: 90 tablet; Refill: 1  GERD without esophagitis -     CBC with Differential/Platelet; Future  NASH (nonalcoholic steatohepatitis)- She is working on her lifestyle modifications. -     Hepatic function panel; Future  Prediabetes -     Basic metabolic panel; Future -     Hemoglobin A1c; Future  Encounter for general adult medical examination with abnormal findings- Exam completed, labs reviewed, vaccines reviewed, cancer screenings addressed, patient education was given.  Pure hyperglyceridemia -     CT CARDIAC SCORING (SELF PAY ONLY); Future  GAD (generalized anxiety disorder) -     ALPRAZolam; Take 1 tablet (0.5 mg total) by mouth 3 (three) times daily as needed for anxiety.  Dispense: 270 tablet; Refill: 0  Obesity (BMI 30.0-34.9)  Hypertension, essential, benign -     EKG 12-Lead -     Urinalysis, Routine w reflex microscopic; Future -     CT CARDIAC SCORING (SELF PAY ONLY); Future -     Olmesartan Medoxomil; Take 1 tablet (20 mg total) by mouth daily.  Dispense: 90 tablet; Refill: 1  Abnormal electrocardiogram (ECG) (EKG) -     CT CARDIAC SCORING (SELF PAY ONLY); Future  Abnormal electrocardiogram -     CT CARDIAC SCORING (SELF PAY ONLY); Future  Type 2 diabetes mellitus with other specified complication, without long-term current use of insulin -     HM Diabetes Foot Exam -     Ambulatory referral to Ophthalmology  Other orders -     LDL cholesterol, direct     Follow-up: Return in about 6 months (around 11/18/2022).  Sanda Linger, MD

## 2022-05-20 DIAGNOSIS — E119 Type 2 diabetes mellitus without complications: Secondary | ICD-10-CM | POA: Insufficient documentation

## 2022-05-20 DIAGNOSIS — R9431 Abnormal electrocardiogram [ECG] [EKG]: Secondary | ICD-10-CM | POA: Insufficient documentation

## 2022-05-20 LAB — URINALYSIS, ROUTINE W REFLEX MICROSCOPIC
Bilirubin Urine: NEGATIVE
Hgb urine dipstick: NEGATIVE
Ketones, ur: NEGATIVE
Nitrite: POSITIVE — AB
Specific Gravity, Urine: 1.025 (ref 1.000–1.030)
Total Protein, Urine: NEGATIVE
Urine Glucose: NEGATIVE
Urobilinogen, UA: 0.2 (ref 0.0–1.0)
pH: 6 (ref 5.0–8.0)

## 2022-05-20 MED ORDER — ROSUVASTATIN CALCIUM 20 MG PO TABS
20.00 mg | ORAL_TABLET | Freq: Every day | ORAL | 1 refills | Status: DC
Start: 2022-05-20 — End: 2022-08-03

## 2022-05-20 MED ORDER — OLMESARTAN MEDOXOMIL 20 MG PO TABS
20.0000 mg | ORAL_TABLET | Freq: Every day | ORAL | 1 refills | Status: DC
Start: 2022-05-20 — End: 2022-08-03

## 2022-05-21 ENCOUNTER — Encounter: Payer: Self-pay | Admitting: Internal Medicine

## 2022-05-23 ENCOUNTER — Encounter: Payer: Self-pay | Admitting: Internal Medicine

## 2022-05-25 ENCOUNTER — Other Ambulatory Visit: Payer: Self-pay | Admitting: Internal Medicine

## 2022-05-25 DIAGNOSIS — E781 Pure hyperglyceridemia: Secondary | ICD-10-CM

## 2022-06-11 LAB — HM DIABETES EYE EXAM

## 2022-07-21 ENCOUNTER — Ambulatory Visit
Admission: RE | Admit: 2022-07-21 | Discharge: 2022-07-21 | Disposition: A | Discharge status: Home / Self Care | Payer: No Typology Code available for payment source | Source: Ambulatory Visit | Attending: Internal Medicine | Admitting: Internal Medicine

## 2022-07-21 DIAGNOSIS — E781 Pure hyperglyceridemia: Secondary | ICD-10-CM

## 2022-07-21 DIAGNOSIS — E785 Hyperlipidemia, unspecified: Secondary | ICD-10-CM

## 2022-07-21 DIAGNOSIS — I10 Essential (primary) hypertension: Secondary | ICD-10-CM

## 2022-07-21 DIAGNOSIS — R9431 Abnormal electrocardiogram [ECG] [EKG]: Secondary | ICD-10-CM

## 2022-08-03 ENCOUNTER — Other Ambulatory Visit: Payer: Self-pay | Admitting: Internal Medicine

## 2022-08-03 DIAGNOSIS — I10 Essential (primary) hypertension: Secondary | ICD-10-CM

## 2022-08-03 DIAGNOSIS — E785 Hyperlipidemia, unspecified: Secondary | ICD-10-CM

## 2022-08-21 ENCOUNTER — Other Ambulatory Visit: Payer: Self-pay | Admitting: Internal Medicine

## 2022-08-21 ENCOUNTER — Telehealth: Payer: Self-pay | Admitting: Internal Medicine

## 2022-08-21 DIAGNOSIS — F411 Generalized anxiety disorder: Secondary | ICD-10-CM

## 2022-08-21 NOTE — Telephone Encounter (Signed)
Pt has moved and she no longer stay in [facility] please send all medication to  [facility]  [address], [place], [place] [address]  [phone]  Th Rx that was sent to the pharmacy  [facility] [place] [facility] to go to the pharmacy up above please   ALPRAZolam Prudy Feeler) 0.5 MG tablet

## 2022-08-25 ENCOUNTER — Other Ambulatory Visit: Payer: Self-pay | Admitting: Internal Medicine

## 2022-08-25 DIAGNOSIS — F411 Generalized anxiety disorder: Secondary | ICD-10-CM

## 2022-08-25 MED ORDER — ALPRAZOLAM 0.5 MG PO TABS
0.5000 mg | ORAL_TABLET | Freq: Three times a day (TID) | ORAL | 0 refills | Status: DC | PRN
Start: 2022-08-25 — End: 2022-11-27

## 2022-08-25 NOTE — Telephone Encounter (Signed)
Patient called to check on the status of her request. She would like for her ALPRAZolam Prudy Feeler) 0.5 MG tablet to be sent to [facility] on [address] in [place], [place]. Best callback is [phone].

## 2022-11-21 ENCOUNTER — Other Ambulatory Visit: Payer: Self-pay | Admitting: Internal Medicine

## 2022-11-21 DIAGNOSIS — E781 Pure hyperglyceridemia: Secondary | ICD-10-CM

## 2022-11-27 ENCOUNTER — Other Ambulatory Visit: Payer: Self-pay | Admitting: Internal Medicine

## 2022-11-27 DIAGNOSIS — F411 Generalized anxiety disorder: Secondary | ICD-10-CM

## 2022-11-27 MED ORDER — ALPRAZOLAM 0.5 MG PO TABS
0.5000 mg | ORAL_TABLET | Freq: Three times a day (TID) | ORAL | 0 refills | Status: DC | PRN
Start: 2022-11-27 — End: 2023-03-08

## 2022-11-27 MED ORDER — FLUOXETINE HCL 20 MG PO CAPS: 20.0000 mg | ORAL_CAPSULE | Freq: Every day | ORAL | 0 refills | Status: DC

## 2022-12-15 ENCOUNTER — Encounter: Payer: Self-pay | Admitting: Internal Medicine

## 2022-12-15 NOTE — Telephone Encounter (Signed)
 Care team updated and letter sent for eye exam notes.

## 2022-12-16 ENCOUNTER — Telehealth: Payer: Self-pay | Admitting: Pharmacy Technician

## 2022-12-16 ENCOUNTER — Ambulatory Visit (INDEPENDENT_AMBULATORY_CARE_PROVIDER_SITE_OTHER): Payer: Medicare HMO

## 2022-12-16 ENCOUNTER — Other Ambulatory Visit (HOSPITAL_COMMUNITY): Payer: Self-pay

## 2022-12-16 VITALS — Ht 61.0 in | Wt 167.0 lb

## 2022-12-16 DIAGNOSIS — Z0000 Encounter for general adult medical examination without abnormal findings: Secondary | ICD-10-CM | POA: Diagnosis not present

## 2022-12-16 DIAGNOSIS — E1169 Type 2 diabetes mellitus with other specified complication: Secondary | ICD-10-CM | POA: Diagnosis not present

## 2022-12-16 NOTE — Patient Instructions (Signed)
Ms. Sifford , Thank you for taking time to come for your Medicare Wellness Visit. I appreciate your ongoing commitment to your health goals. Please review the following plan we discussed and let me know if I can assist you in the future.   Referrals/Orders/Follow-Ups/Clinician Recommendations: You will are due for a A1C and a urine/kidney during your next visit with Dr. Yetta Barre.  Keep up the good work.  This is a list of the screening recommended for you and due dates:  Health Maintenance  Topic Date Due   Yearly kidney health urinalysis for diabetes  Never done   Zoster (Shingles) Vaccine (1 of 2) Never done   Eye exam for diabetics  01/28/2011   Flu Shot  08/28/2022   COVID-19 Vaccine (1 - 2023-24 season) Never done   Hemoglobin A1C  11/18/2022   Cologuard (Stool DNA test)  05/08/2023   Yearly kidney function blood test for diabetes  05/19/2023   Complete foot exam   05/21/2023   Medicare Annual Wellness Visit  12/16/2023   Pneumonia Vaccine (3 of 3 - PPSV23 or PCV20) 04/10/2024   Mammogram  05/15/2024   DTaP/Tdap/Td vaccine (3 - Td or Tdap) 10/21/2025   DEXA scan (bone density measurement)  Completed   Hepatitis C Screening  Completed   HPV Vaccine  Aged Out    Advanced directives: (Copy Requested) Please bring a copy of your health care power of attorney and living will to the office to be added to your chart at your convenience.  Next Medicare Annual Wellness Visit scheduled for next year: Yes

## 2022-12-16 NOTE — Progress Notes (Signed)
Subjective:   Monica Barnes is a 67 y.o. female who presents for Medicare Annual (Subsequent) preventive examination.  Visit Complete: Virtual I connected with  Lezlie Lye on 12/16/22 by a audio enabled telemedicine application and verified that I am speaking with the correct person using two identifiers.  Patient Location: Home  Provider Location: Home Office  I discussed the limitations of evaluation and management by telemedicine. The patient expressed understanding and agreed to proceed.  Vital Signs: Because this visit was a virtual/telehealth visit, some criteria may be missing or patient reported. Any vitals not documented were not able to be obtained and vitals that have been documented are patient reported.  Patient Medicare AWV questionnaire was completed by the patient on 12/12/2022; I have confirmed that all information answered by patient is correct and no changes since this date.  Cardiac Risk Factors include: advanced age (>75men, >62 women);hypertension;diabetes mellitus;dyslipidemia     Objective:    Today's Vitals   12/16/22 1305  Weight: 167 lb (75.8 kg)  Height: 5\' 1"  (1.549 m)   Body mass index is 31.55 kg/m.     12/16/2022    1:20 PM 07/08/2021    4:20 PM 07/01/2021    3:00 PM 05/13/2021    1:31 PM 05/01/2020    3:47 PM  Advanced Directives  Does Patient Have a Medical Advance Directive? Yes No No Yes Yes  Type of Estate agent of [place];Living will   Healthcare Power of Attorney Living will;Healthcare Power of Attorney  Does patient want to make changes to medical advance directive?     No - Patient declined  Copy of Healthcare Power of Attorney in Chart? No - copy requested   No - copy requested No - copy requested  Would patient like information on creating a medical advance directive?  No - Patient declined       Current Medications (verified) Outpatient Encounter Medications as of 12/16/2022  Medication Sig   ALPRAZolam  (XANAX) 0.5 MG tablet Take 1 tablet (0.5 mg total) by mouth 3 (three) times daily as needed for anxiety.   Ascorbic Acid (VITAMIN C) 1000 MG tablet Take 1,000 mg by mouth daily.   B Complex-C-Folic Acid (STRESS B COMPLEX PO) Take 1 capsule by mouth daily.   carboxymethylcellulose (REFRESH PLUS) 0.5 % SOLN Place 1 drop into both eyes daily as needed (dry eyes).   cholecalciferol (VITAMIN D3) 25 MCG (1000 UNIT) tablet Take 1,000 Units by mouth daily.   Cyanocobalamin (B-12 SL) Place 1 tablet under the tongue daily.   dexlansoprazole (DEXILANT) 60 MG capsule Take 1 capsule (60 mg total) by mouth daily.   doxylamine, Sleep, (UNISOM) 25 MG tablet Take 25 mg by mouth at bedtime.   ferrous sulfate 325 (65 FE) MG tablet Take 325 mg by mouth daily with breakfast.   FLUoxetine (PROZAC) 20 MG capsule Take 1 capsule (20 mg total) by mouth daily.   MAGNESIUM PO Take 1 tablet by mouth daily.   Multiple Vitamin (MULTIVITAMIN WITH MINERALS) TABS tablet Take 1 tablet by mouth daily.   olmesartan (BENICAR) 20 MG tablet TAKE 1 TABLET BY MOUTH EVERY DAY   omega-3 acid ethyl esters (LOVAZA) 1 g capsule TAKE 2 CAPSULES BY MOUTH TWICE A DAY   rosuvastatin (CRESTOR) 20 MG tablet TAKE 1 TABLET BY MOUTH EVERY DAY   TURMERIC PO Take 1 capsule by mouth daily.   Vitamin D-Vitamin K (VITAMIN K2-VITAMIN D3 PO) Take 1 capsule by mouth daily.   vitamin  E 180 MG (400 UNITS) capsule Take 400 Units by mouth daily.   zinc gluconate 50 MG tablet Take 50 mg by mouth daily.   No facility-administered encounter medications on file as of 12/16/2022.    Allergies (verified) Penicillins and Latex   History: Past Medical History:  Diagnosis Date   Anemia    Asthma    Complication of anesthesia    post-operative N/V   Depression    Genital warts    Headache(784.0)    Heart murmur    may have been told she had a murmur in 1970's, but not recently (07/01/21)   PONV (postoperative nausea and vomiting)    Restless legs  09/20/2010   Upper airway resistance syndrome 09/03/2010   Past Surgical History:  Procedure Laterality Date   APPENDECTOMY  01/27/1958   PAROTIDECTOMY Right 07/08/2021   Procedure: PAROTIDECTOMY WITH FACIAL NERVE DISSECTION;  Surgeon: Serena Colonel, MD;  Location: [facility] OR;  Service: ENT;  Laterality: Right;   removal birth mark  1961 and 1963   left arm   tibia fx Right    TUBAL LIGATION  01/27/1981   Family History  Problem Relation Age of Onset   Throat cancer Father    Liver cancer Father    Alcohol abuse Father    Hypertension Father    Pancreatic cancer Father    Emphysema Mother    COPD Mother    Asthma Mother    Alcohol abuse Other    Drug abuse Other    Heart disease Maternal Grandmother    Lung cancer Paternal Uncle    Prostate cancer Maternal Grandfather    Colon cancer Neg Hx    Stomach cancer Neg Hx    Stroke Neg Hx    Early death Neg Hx    Hyperlipidemia Neg Hx    Social History   Socioeconomic History   Marital status: Married    Spouse name: Gerarda Gunther   Number of children: Not on file   Years of education: Not on file   Highest education level: Not on file  Occupational History   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    Employer: [employer]  Tobacco Use   Smoking status: Former    Current packs/day: 0.30    Average packs/day: 0.3 packs/day for 40.0 years (12.0 ttl pk-yrs)    Types: Cigarettes   Smokeless tobacco: Never   Tobacco comments:    smokes 1 pack cigarettes a month  Vaping Use   Vaping status: Never Used  Substance and Sexual Activity   Alcohol use: Yes    Alcohol/week: 10.0 standard drinks of alcohol    Types: 10 Glasses of wine per week   Drug use: No   Sexual activity: Yes    Birth control/protection: Post-menopausal  Other Topics Concern   Not on file  Social History Narrative   Caffienated drinks-yes   Seat belt use often-yes   Regular Exercise-no   Smoke alarm in the home-no   Firearms/guns in  the home-yes   History of physical abuse-no               Social Determinants of Health   Financial Resource Strain: Low Risk  (12/12/2022)   Overall Financial Resource Strain (CARDIA)    Difficulty of Paying Living Expenses: Not hard at all  Food Insecurity: No Food Insecurity (12/12/2022)   Hunger Vital Sign    Worried About Running Out of Food in the Last  Year: Never true    Ran Out of Food in the Last Year: Never true  Transportation Needs: No Transportation Needs (12/12/2022)   PRAPARE - Administrator, Civil Service (Medical): No    Lack of Transportation (Non-Medical): No  Physical Activity: Inactive (12/12/2022)   Exercise Vital Sign    Days of Exercise per Week: 0 days    Minutes of Exercise per Session: 0 min  Stress: No Stress Concern Present (12/12/2022)   [employer] of Occupational Health - Occupational Stress Questionnaire    Feeling of Stress : Only a little  Social Connections: Unknown (12/12/2022)   Social Connection and Isolation Panel [NHANES]    Frequency of Communication with Friends and Family: Once a week    Frequency of Social Gatherings with Friends and Family: Once a week    Attends Religious Services: Not on Marketing executive or Organizations: No    Attends Banker Meetings: Never    Marital Status: Married    Tobacco Counseling Counseling given: Not Answered Tobacco comments: smokes 1 pack cigarettes a month   Clinical Intake:  Pre-visit preparation completed: Yes  Pain : No/denies pain     BMI - recorded: 31.55 Nutritional Status: BMI > 30  Obese Nutritional Risks: Nausea/ vomitting/ diarrhea (some diarrhea) Diabetes: Yes CBG done?: No Did pt. bring in CBG monitor from home?: No  How often do you need to have someone help you when you read instructions, pamphlets, or other written materials from your doctor or pharmacy?: 1 - Never  Interpreter Needed?: No  Information entered by ::  Temara Lanum, RMA   Activities of Daily Living    12/12/2022    9:34 AM 05/19/2022    5:35 AM  In your present state of health, do you have any difficulty performing the following activities:  Hearing? 0 0  Comment Rt ear noticed some loss   Vision? 0 0  Difficulty concentrating or making decisions? 0 0  Walking or climbing stairs? 0 0  Dressing or bathing? 0 0  Doing errands, shopping? 0 0  Preparing Food and eating ? N N  Using the Toilet? N N  In the past six months, have you accidently leaked urine? Y N  Do you have problems with loss of bowel control? N N  Managing your Medications? N N  Managing your Finances? N N  Housekeeping or managing your Housekeeping? N N    Patient Care Team: Etta Grandchild, MD as PCP - General (Internal Medicine) Freddy Finner, MD (Inactive) (Obstetrics and Gynecology) Shea Evans [facility])  Indicate any recent Medical Services you may have received from other than [facility] providers in the past year (date may be approximate).     Assessment:   This is a routine wellness examination for Chloa.  Hearing/Vision screen Hearing Screening - Comments:: Denies hearing difficulties ' Vision Screening - Comments:: Wears eyeglasses   Goals Addressed               This Visit's Progress     Patient Stated (pt-stated)        Would like to join a gym.      Depression Screen    12/16/2022    1:24 PM 05/19/2022    1:50 PM 05/13/2021    1:32 PM 05/13/2021    1:30 PM 05/01/2020    3:27 PM 05/01/2020    2:14 PM 04/11/2019   10:21 AM  PHQ  2/9 Scores  PHQ - 2 Score 0 3 0 0 2 1 0  PHQ- 9 Score 0 7   7  0    Fall Risk    12/12/2022    9:34 AM 05/19/2022    5:35 AM 05/13/2021    1:31 PM 05/13/2021    6:19 AM 05/01/2020    2:17 PM  Fall Risk   Falls in the past year? 1 0 1 1 0  Number falls in past yr: 0 0 0 0 0  Injury with Fall? 1 0 1 1 0  Follow up Falls evaluation completed;Falls prevention discussed  Falls evaluation  completed;Education provided      MEDICARE RISK AT HOME: Medicare Risk at Home Any stairs in or around the home?: Yes If so, are there any without handrails?: No Home free of loose throw rugs in walkways, pet beds, electrical cords, etc?: No Adequate lighting in your home to reduce risk of falls?: Yes Life alert?: No Use of a cane, walker or w/c?: Yes Grab bars in the bathroom?: No Shower chair or bench in shower?: Yes Elevated toilet seat or a handicapped toilet?: No  TIMED UP AND GO:  Was the test performed?  No    Cognitive Function:        12/16/2022    1:22 PM  6CIT Screen  What Year? 0 points  What month? 0 points  What time? 0 points  Count back from 20 0 points  Months in reverse 0 points  Repeat phrase 0 points  Total Score 0 points    Immunizations Immunization History  Administered Date(s) Administered   Influenza Whole 11/10/2012   Pneumococcal Conjugate-13 04/11/2019   Pneumococcal Polysaccharide-23 08/30/2008, 04/06/2017   Tdap 07/22/2007, 10/22/2015    TDAP status: Up to date  Flu Vaccine status: Declined, Education has been provided regarding the importance of this vaccine but patient still declined. Advised may receive this vaccine at local pharmacy or Health Dept. Aware to provide a copy of the vaccination record if obtained from local pharmacy or Health Dept. Verbalized acceptance and understanding.  Pneumococcal vaccine status: Up to date  Covid-19 vaccine status: Declined, Education has been provided regarding the importance of this vaccine but patient still declined. Advised may receive this vaccine at local pharmacy or Health Dept.or vaccine clinic. Aware to provide a copy of the vaccination record if obtained from local pharmacy or Health Dept. Verbalized acceptance and understanding.  Qualifies for Shingles Vaccine? Yes   Zostavax completed No   Shingrix Completed?: No.    Education has been provided regarding the importance of this  vaccine. Patient has been advised to call insurance company to determine out of pocket expense if they have not yet received this vaccine. Advised may also receive vaccine at local pharmacy or Health Dept. Verbalized acceptance and understanding.  Screening Tests Health Maintenance  Topic Date Due   Diabetic kidney evaluation - Urine ACR  Never done   Zoster Vaccines- Shingrix (1 of 2) Never done   OPHTHALMOLOGY EXAM  01/28/2011   INFLUENZA VACCINE  08/28/2022   COVID-19 Vaccine (1 - 2023-24 season) Never done   HEMOGLOBIN A1C  11/18/2022   Fecal DNA (Cologuard)  05/08/2023   Diabetic kidney evaluation - eGFR measurement  05/19/2023   FOOT EXAM  05/21/2023   Medicare Annual Wellness (AWV)  12/16/2023   Pneumonia Vaccine 80+ Years old (3 of 3 - PPSV23 or PCV20) 04/10/2024   MAMMOGRAM  05/15/2024   DTaP/Tdap/Td (3 -  Td or Tdap) 10/21/2025   DEXA SCAN  Completed   Hepatitis C Screening  Completed   HPV VACCINES  Aged Out    Health Maintenance  Health Maintenance Due  Topic Date Due   Diabetic kidney evaluation - Urine ACR  Never done   Zoster Vaccines- Shingrix (1 of 2) Never done   OPHTHALMOLOGY EXAM  01/28/2011   INFLUENZA VACCINE  08/28/2022   COVID-19 Vaccine (1 - 2023-24 season) Never done   HEMOGLOBIN A1C  11/18/2022    Colorectal cancer screening: Type of screening: Cologuard. Completed 05/07/2020. Repeat every 3 years  Mammogram status: Completed 05/16/2022. Repeat every year  Lung Cancer Screening: (Low Dose CT Chest recommended if Age 97-80 years, 20 pack-year currently smoking OR have quit w/in 15years.) does not qualify.   Lung Cancer Screening Referral: N/A  Additional Screening:  Hepatitis C Screening: does qualify; Completed 04/06/2017  Vision Screening: Recommended annual ophthalmology exams for early detection of glaucoma and other disorders of the eye. Is the patient up to date with their annual eye exam?  Yes  Who is the provider or what is the name of  the office in which the patient attends annual eye exams? Dr. Shea Evans If pt is not established with a provider, would they like to be referred to a provider to establish care? No .   Dental Screening: Recommended annual dental exams for proper oral hygiene  Diabetic Foot Exam: Diabetic Foot Exam: Completed 05/21/2022  Community Resource Referral / Chronic Care Management: CRR required this visit?  No   CCM required this visit?  No     Plan:     I have personally reviewed and noted the following in the patient's chart:   Medical and social history Use of alcohol, tobacco or illicit drugs  Current medications and supplements including opioid prescriptions. Patient is not currently taking opioid prescriptions. Functional ability and status Nutritional status Physical activity Advanced directives List of other physicians Hospitalizations, surgeries, and ER visits in previous 12 months Vitals Screenings to include cognitive, depression, and falls Referrals and appointments  In addition, I have reviewed and discussed with patient certain preventive protocols, quality metrics, and best practice recommendations. A written personalized care plan for preventive services as well as general preventive health recommendations were provided to patient.     Arhaan Chesnut L Manav Pierotti, CMA   12/16/2022   After Visit Summary: (MyChart) Due to this being a telephonic visit, the after visit summary with patients personalized plan was offered to patient via MyChart   Nurse Notes: Patient declines Flu vaccine, Shingrix and Covid vaccines.  She is due for a A1C and a UARC.  Orders have been placed.  She is declining a DEXA today.  Patient stated that she has had her annual eye exam sometime in May of this year. I have sent for records today.   Patient stated that she will call the office to schedule a 6 month follow up visit with Dr. Yetta Barre.  She would like to discuss maybe getting medication Ozempic.  Patient had  no other concerns to address today.

## 2022-12-16 NOTE — Telephone Encounter (Signed)
Pharmacy Patient Advocate Encounter   Received notification from CoverMyMeds that prior authorization for Omega-3-acid Ethyl Esters 1GM capsules is required/requested.   Insurance verification completed.   The patient is insured through [facility] .   Per test claim: PA required; PA submitted to above mentioned insurance via CoverMyMeds Key/confirmation #/EOC [id number] Status is pending

## 2022-12-17 ENCOUNTER — Ambulatory Visit (INDEPENDENT_AMBULATORY_CARE_PROVIDER_SITE_OTHER): Payer: Medicare HMO | Admitting: Internal Medicine

## 2022-12-17 ENCOUNTER — Encounter: Payer: Self-pay | Admitting: Internal Medicine

## 2022-12-17 VITALS — BP 132/78 | HR 79 | Temp 98.0°F | Resp 16 | Ht 61.0 in | Wt 183.6 lb

## 2022-12-17 DIAGNOSIS — Z7985 Long-term (current) use of injectable non-insulin antidiabetic drugs: Secondary | ICD-10-CM

## 2022-12-17 DIAGNOSIS — K7581 Nonalcoholic steatohepatitis (NASH): Secondary | ICD-10-CM

## 2022-12-17 DIAGNOSIS — I10 Essential (primary) hypertension: Secondary | ICD-10-CM | POA: Diagnosis not present

## 2022-12-17 DIAGNOSIS — E785 Hyperlipidemia, unspecified: Secondary | ICD-10-CM | POA: Diagnosis not present

## 2022-12-17 DIAGNOSIS — E1159 Type 2 diabetes mellitus with other circulatory complications: Secondary | ICD-10-CM | POA: Diagnosis not present

## 2022-12-17 DIAGNOSIS — E781 Pure hyperglyceridemia: Secondary | ICD-10-CM

## 2022-12-17 DIAGNOSIS — Z794 Long term (current) use of insulin: Secondary | ICD-10-CM

## 2022-12-17 LAB — HEPATIC FUNCTION PANEL
ALT: 26 U/L (ref 0–35)
AST: 32 U/L (ref 0–37)
Albumin: 5 g/dL (ref 3.5–5.2)
Alkaline Phosphatase: 68 U/L (ref 39–117)
Bilirubin, Direct: 0.1 mg/dL (ref 0.0–0.3)
Total Bilirubin: 0.5 mg/dL (ref 0.2–1.2)
Total Protein: 7.4 g/dL (ref 6.0–8.3)

## 2022-12-17 LAB — MICROALBUMIN / CREATININE URINE RATIO
Creatinine,U: 101.1 mg/dL
Microalb Creat Ratio: 3.6 mg/g (ref 0.0–30.0)
Microalb, Ur: 3.6 mg/dL — ABNORMAL HIGH (ref 0.0–1.9)

## 2022-12-17 LAB — URINALYSIS, ROUTINE W REFLEX MICROSCOPIC
Bilirubin Urine: NEGATIVE
Hgb urine dipstick: NEGATIVE
Ketones, ur: NEGATIVE
Nitrite: POSITIVE — AB
Specific Gravity, Urine: 1.02 (ref 1.000–1.030)
Total Protein, Urine: NEGATIVE
Urine Glucose: NEGATIVE
Urobilinogen, UA: 0.2 (ref 0.0–1.0)
pH: 6.5 (ref 5.0–8.0)

## 2022-12-17 LAB — BASIC METABOLIC PANEL
BUN: 18 mg/dL (ref 6–23)
CO2: 28 meq/L (ref 19–32)
Calcium: 9.8 mg/dL (ref 8.4–10.5)
Chloride: 103 meq/L (ref 96–112)
Creatinine, Ser: 0.73 mg/dL (ref 0.40–1.20)
GFR: 84.24 mL/min (ref >60.00)
Glucose, Bld: 111 mg/dL — ABNORMAL HIGH (ref 70–99)
Potassium: 4.2 meq/L (ref 3.5–5.1)
Sodium: 140 meq/L (ref 135–145)

## 2022-12-17 LAB — CBC WITH DIFFERENTIAL/PLATELET
Basophils Absolute: 0 10*3/uL (ref 0.0–0.1)
Basophils Relative: 0.4 % (ref 0.0–3.0)
Eosinophils Absolute: 0.1 10*3/uL (ref 0.0–0.7)
Eosinophils Relative: 1.5 % (ref 0.0–5.0)
HCT: 40.9 % (ref 36.0–46.0)
Hemoglobin: 13.6 g/dL (ref 12.0–15.0)
Lymphocytes Relative: 23.3 % (ref 12.0–46.0)
Lymphs Abs: 1.7 10*3/uL (ref 0.7–4.0)
MCHC: 33.2 g/dL (ref 30.0–36.0)
MCV: 96.1 fL (ref 78.0–100.0)
Monocytes Absolute: 0.6 10*3/uL (ref 0.1–1.0)
Monocytes Relative: 7.9 % (ref 3.0–12.0)
Neutro Abs: 4.9 10*3/uL (ref 1.4–7.7)
Neutrophils Relative %: 66.9 % (ref 43.0–77.0)
Platelets: 205 10*3/uL (ref 150.0–400.0)
RBC: 4.26 Mil/uL (ref 3.87–5.11)
RDW: 13.2 % (ref 11.5–15.5)
WBC: 7.3 10*3/uL (ref 4.0–10.5)

## 2022-12-17 LAB — TRIGLYCERIDES: Triglycerides: 265 mg/dL — ABNORMAL HIGH (ref 0.0–149.0)

## 2022-12-17 LAB — HEMOGLOBIN A1C: Hgb A1c MFr Bld: 6.3 % (ref 4.6–6.5)

## 2022-12-17 MED ORDER — INSULIN PEN NEEDLE 32G X 6 MM MISC
1.0000 | 0 refills | Status: DC
Start: 2022-12-17 — End: 2023-05-14

## 2022-12-17 MED ORDER — OZEMPIC (0.25 OR 0.5 MG/DOSE) 2 MG/3ML ~~LOC~~ SOPN
0.2500 mg | PEN_INJECTOR | SUBCUTANEOUS | 0 refills | Status: DC
Start: 2022-12-17 — End: 2023-01-13

## 2022-12-17 NOTE — Telephone Encounter (Signed)
 Additional information has been requested from the patient's insurance in order to proceed with the prior authorization request. Requested information has been sent, or form has been filled out and faxed back to [phone]

## 2022-12-17 NOTE — Patient Instructions (Signed)
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
## 2022-12-18 LAB — PROTIME-INR
INR: 1.1 {ratio} — ABNORMAL HIGH (ref 0.8–1.0)
Prothrombin Time: 11.5 s (ref 9.6–13.1)

## 2022-12-18 NOTE — Telephone Encounter (Signed)
Pharmacy Patient Advocate Encounter  Received notification from [facility] that Prior Authorization for Omega-3-acid Ethyl Esters 1GM capsules has been DENIED.  Full denial letter will be uploaded to the media tab. See denial reason below.   PA #/Case ID/Reference #: [id number]

## 2022-12-19 ENCOUNTER — Other Ambulatory Visit: Payer: Self-pay | Admitting: Internal Medicine

## 2022-12-19 NOTE — Telephone Encounter (Signed)
Please advise. Are we going to try something else or just inform the patient about the denial.

## 2022-12-22 ENCOUNTER — Ambulatory Visit: Payer: Medicare HMO | Admitting: Internal Medicine

## 2023-01-13 ENCOUNTER — Other Ambulatory Visit: Payer: Self-pay | Admitting: Internal Medicine

## 2023-01-13 DIAGNOSIS — E1159 Type 2 diabetes mellitus with other circulatory complications: Secondary | ICD-10-CM

## 2023-01-13 MED ORDER — OZEMPIC (0.25 OR 0.5 MG/DOSE) 2 MG/3ML ~~LOC~~ SOPN
0.5000 mg | PEN_INJECTOR | SUBCUTANEOUS | 0 refills | Status: DC
Start: 2023-01-13 — End: 2023-02-10

## 2023-01-15 ENCOUNTER — Ambulatory Visit: Payer: Medicare HMO | Admitting: Internal Medicine

## 2023-02-10 ENCOUNTER — Other Ambulatory Visit: Payer: Self-pay | Admitting: Internal Medicine

## 2023-02-10 DIAGNOSIS — E1159 Type 2 diabetes mellitus with other circulatory complications: Secondary | ICD-10-CM

## 2023-02-10 MED ORDER — OZEMPIC (0.25 OR 0.5 MG/DOSE) 2 MG/3ML ~~LOC~~ SOPN
0.5000 mg | PEN_INJECTOR | SUBCUTANEOUS | 0 refills | Status: DC
Start: 2023-02-10 — End: 2023-02-10

## 2023-02-10 MED ORDER — OZEMPIC (0.25 OR 0.5 MG/DOSE) 2 MG/3ML ~~LOC~~ SOPN
0.5000 mg | PEN_INJECTOR | SUBCUTANEOUS | 0 refills | Status: DC
Start: 2023-02-10 — End: 2023-05-05

## 2023-02-24 ENCOUNTER — Telehealth: Payer: Self-pay | Admitting: Internal Medicine

## 2023-02-24 NOTE — Telephone Encounter (Signed)
Paperwork has been completed and placed on Dr. Yetta Barre desk for him to sign when he returns.

## 2023-02-24 NOTE — Telephone Encounter (Signed)
Pt left documents for a med assistance program pt also placed a empty bottle of Dexilant as well I'm guessing she needs a prescription on that particular medication. -- paperwork is in providers mailbox. Please advise, Thanks Cb # [phone]

## 2023-02-27 ENCOUNTER — Other Ambulatory Visit: Payer: Self-pay | Admitting: Internal Medicine

## 2023-02-27 DIAGNOSIS — F411 Generalized anxiety disorder: Secondary | ICD-10-CM

## 2023-03-04 ENCOUNTER — Other Ambulatory Visit (HOSPITAL_COMMUNITY): Payer: Self-pay

## 2023-03-04 ENCOUNTER — Telehealth: Payer: Self-pay

## 2023-03-04 NOTE — Telephone Encounter (Signed)
 Received NOTIFICATION IN REGARDS TO  missing provider pages For PAP OF DEXILANT .   PLEASE BE ADVISE TO GET PROVIDER TO FILL OUT PROVIDER PAGES AND SEND BACK TO COMPANY FOR PROCESSING

## 2023-03-04 NOTE — Telephone Encounter (Signed)
 Copied from CRM [id number]. Topic: Clinical - Request for Lab/Test Order >> Mar 04, 2023  2:18 PM Tiffany H wrote: Reason for CRM: Darren with [employer] called to advise that Page 3, Section 3 and 4 are missing on patient's application. Please re-fax.   Phone: [phone] Fax: [phone]

## 2023-03-05 NOTE — Telephone Encounter (Signed)
 Copied from CRM [id number]. Topic: General - Other >> Mar 05, 2023  9:05 AM Melissa C wrote: Reason for CRM: patient and her husband dropped off paperwork  a few weeks ago for authorization of prescription for doctor to sign and fax and she got a call that company has still not received paperwork yet. Please advise. Thank you

## 2023-03-05 NOTE — Telephone Encounter (Signed)
 Spoke with patients husband, I have tried faxing forms 2x fax number not working. Tried once more today, awaiting confirmation. If any further issues, I will reach out to [employer]

## 2023-03-05 NOTE — Telephone Encounter (Signed)
 Fax succesfully went through, confirmation received.

## 2023-03-06 ENCOUNTER — Other Ambulatory Visit: Payer: Self-pay | Admitting: Internal Medicine

## 2023-03-06 DIAGNOSIS — F411 Generalized anxiety disorder: Secondary | ICD-10-CM

## 2023-03-09 NOTE — Telephone Encounter (Signed)
 This has been handled.

## 2023-04-22 ENCOUNTER — Other Ambulatory Visit: Payer: Self-pay | Admitting: Internal Medicine

## 2023-04-22 DIAGNOSIS — I10 Essential (primary) hypertension: Secondary | ICD-10-CM

## 2023-04-22 DIAGNOSIS — E785 Hyperlipidemia, unspecified: Secondary | ICD-10-CM

## 2023-05-05 ENCOUNTER — Other Ambulatory Visit: Payer: Self-pay | Admitting: Internal Medicine

## 2023-05-05 DIAGNOSIS — E1159 Type 2 diabetes mellitus with other circulatory complications: Secondary | ICD-10-CM

## 2023-05-05 MED ORDER — SEMAGLUTIDE (1 MG/DOSE) 4 MG/3ML ~~LOC~~ SOPN
1.0000 mg | PEN_INJECTOR | SUBCUTANEOUS | 0 refills | Status: DC
Start: 2023-05-05 — End: 2023-05-17

## 2023-05-14 ENCOUNTER — Encounter: Payer: Self-pay | Admitting: Internal Medicine

## 2023-05-14 ENCOUNTER — Ambulatory Visit (INDEPENDENT_AMBULATORY_CARE_PROVIDER_SITE_OTHER): Admitting: Internal Medicine

## 2023-05-14 ENCOUNTER — Ambulatory Visit: Payer: Medicare HMO | Admitting: Internal Medicine

## 2023-05-14 VITALS — BP 132/78 | HR 100 | Temp 97.9°F | Resp 16 | Ht 61.0 in | Wt 173.8 lb

## 2023-05-14 DIAGNOSIS — Z0001 Encounter for general adult medical examination with abnormal findings: Secondary | ICD-10-CM

## 2023-05-14 DIAGNOSIS — I10 Essential (primary) hypertension: Secondary | ICD-10-CM

## 2023-05-14 DIAGNOSIS — E785 Hyperlipidemia, unspecified: Secondary | ICD-10-CM | POA: Diagnosis not present

## 2023-05-14 DIAGNOSIS — F411 Generalized anxiety disorder: Secondary | ICD-10-CM | POA: Diagnosis not present

## 2023-05-14 DIAGNOSIS — Z7985 Long-term (current) use of injectable non-insulin antidiabetic drugs: Secondary | ICD-10-CM

## 2023-05-14 DIAGNOSIS — Z1211 Encounter for screening for malignant neoplasm of colon: Secondary | ICD-10-CM | POA: Insufficient documentation

## 2023-05-14 DIAGNOSIS — E1159 Type 2 diabetes mellitus with other circulatory complications: Secondary | ICD-10-CM | POA: Diagnosis not present

## 2023-05-14 DIAGNOSIS — Z0000 Encounter for general adult medical examination without abnormal findings: Secondary | ICD-10-CM

## 2023-05-14 LAB — URINALYSIS, ROUTINE W REFLEX MICROSCOPIC
Bilirubin Urine: NEGATIVE
Hgb urine dipstick: NEGATIVE
Nitrite: NEGATIVE
Specific Gravity, Urine: >=1.03 — AB (ref 1.000–1.030)
Total Protein, Urine: NEGATIVE
Urine Glucose: NEGATIVE
Urobilinogen, UA: 0.2 (ref 0.0–1.0)
pH: 5.5 (ref 5.0–8.0)

## 2023-05-14 LAB — HEMOGLOBIN A1C: Hgb A1c MFr Bld: 5.9 % (ref 4.6–6.5)

## 2023-05-14 LAB — HEPATIC FUNCTION PANEL
ALT: 23 U/L (ref 0–35)
AST: 32 U/L (ref 0–37)
Albumin: 5.1 g/dL (ref 3.5–5.2)
Alkaline Phosphatase: 64 U/L (ref 39–117)
Bilirubin, Direct: 0.1 mg/dL (ref 0.0–0.3)
Total Bilirubin: 0.5 mg/dL (ref 0.2–1.2)
Total Protein: 7.4 g/dL (ref 6.0–8.3)

## 2023-05-14 LAB — LIPID PANEL
Cholesterol: 181 mg/dL (ref 0–200)
HDL: 76.9 mg/dL (ref >39.00)
LDL Cholesterol: 52 mg/dL (ref 0–99)
NonHDL: 103.99
Total CHOL/HDL Ratio: 2
Triglycerides: 258 mg/dL — ABNORMAL HIGH (ref 0.0–149.0)
VLDL: 51.6 mg/dL — ABNORMAL HIGH (ref 0.0–40.0)

## 2023-05-14 LAB — MICROALBUMIN / CREATININE URINE RATIO
Creatinine,U: 159.3 mg/dL
Microalb Creat Ratio: 32.2 mg/g — ABNORMAL HIGH (ref 0.0–30.0)
Microalb, Ur: 5.1 mg/dL — ABNORMAL HIGH (ref 0.0–1.9)

## 2023-05-14 MED ORDER — ALPRAZOLAM 0.5 MG PO TABS
0.5000 mg | ORAL_TABLET | Freq: Three times a day (TID) | ORAL | 0 refills | Status: DC | PRN
Start: 2023-05-14 — End: 2023-09-07

## 2023-05-14 NOTE — Progress Notes (Unsigned)
 Subjective:  Patient ID: Monica Barnes, female    DOB: January 17, 1956  Age: 68 y.o. MRN: [id number]  CC: Annual Exam, Hypertension, Hyperlipidemia, and Diabetes   HPI Monica Barnes presents for a CPX and f/up ----  Discussed the use of AI scribe software for clinical note transcription with the patient, who gave verbal consent to proceed.  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 This paralysis affects her ability to lift that side of her face, resulting in a droopy eyelid and a crooked smile. She has significant dry eye in the affected eye, impacting her ability to drive as she needs to close both eyes to blink. She is frustrated with these symptoms and is seeking further medical advice on potential treatments.  She also mentions ongoing issues with her leg following a fracture. She has to be cautious when using stairs and notes that her ankle sometimes swells, likely due to the presence of screws, rods, and pins from the previous surgery. This has limited her physical activity. No chest pain, shortness of breath, dizziness, or lightheadedness.       Outpatient Medications Prior to Visit  Medication Sig Dispense Refill   B Complex-C-Folic Acid (STRESS B COMPLEX PO) Take 1 capsule by mouth daily.     carboxymethylcellulose (REFRESH PLUS) 0.5 % SOLN Place 1 drop into both eyes daily as needed (dry eyes).     cholecalciferol  (VITAMIN D3) 25 MCG (1000 UNIT) tablet Take 1,000 Units by mouth daily.     Cyanocobalamin  (B-12 SL) Place 1 tablet under the tongue daily.     dexlansoprazole  (DEXILANT ) 60 MG capsule Take 1 capsule (60 mg total) by mouth daily. 90 capsule 1   doxylamine , Sleep, (UNISOM ) 25 MG tablet Take 25 mg by mouth  at bedtime.     ferrous sulfate  325 (65 FE) MG tablet Take 325 mg by mouth daily with breakfast.     MAGNESIUM  PO Take 1 tablet by mouth daily.     Multiple Vitamin (MULTIVITAMIN WITH MINERALS) TABS tablet Take 1 tablet by mouth daily.     olmesartan  (BENICAR ) 20 MG tablet TAKE 1 TABLET BY MOUTH EVERY DAY 90 tablet 1   rosuvastatin  (CRESTOR ) 20 MG tablet TAKE 1 TABLET BY MOUTH EVERY DAY 90 tablet 1   TURMERIC PO Take 1 capsule by mouth daily.     Vitamin D -Vitamin K (VITAMIN K2-VITAMIN D3 PO) Take 1 capsule by mouth daily.     vitamin E  180 MG (400 UNITS) capsule Take 400 Units by mouth daily.     zinc  gluconate 50 MG tablet Take 50 mg by mouth daily.     ALPRAZolam  (XANAX ) 0.5 MG tablet TAKE 1 TABLET (0.5 MG TOTAL) BY MOUTH 3 (THREE) TIMES DAILY AS NEEDED FOR ANXIETY. 270 tablet 0   Ascorbic Acid  (VITAMIN C) 1000 MG tablet Take 1,000 mg by mouth daily.     FLUoxetine  (PROZAC ) 20 MG capsule TAKE 1 CAPSULE BY MOUTH EVERY DAY 90 capsule 0   Semaglutide , 1 MG/DOSE, 4 MG/3ML SOPN Inject 1 mg as directed once a week. 9 mL 0   Insulin  Pen Needle 32G X 6 MM MISC 1 Act by Does not apply route once a week. 100 each 0   No  facility-administered medications prior to visit.    ROS Review of Systems  Constitutional:  Negative for appetite change, chills, diaphoresis, fatigue and fever.  HENT: Negative.    Eyes: Negative.   Respiratory:  Negative for cough, chest tightness, shortness of breath and wheezing.   Cardiovascular:  Negative for chest pain, palpitations and leg swelling.  Gastrointestinal:  Negative for abdominal pain, constipation, diarrhea, nausea and vomiting.  Endocrine: Negative.   Genitourinary: Negative.  Negative for difficulty urinating and dysuria.  Musculoskeletal:  Positive for arthralgias. Negative for back pain and myalgias.  Skin: Negative.   Neurological:  Positive for facial asymmetry. Negative for dizziness, speech difficulty, weakness and headaches.  Hematological:   Negative for adenopathy. Does not bruise/bleed easily.  Psychiatric/Behavioral:  Positive for sleep disturbance. Negative for confusion, decreased concentration, dysphoric mood, self-injury and suicidal ideas. The patient is nervous/anxious.     Objective:  BP 132/78 (BP Location: Left Arm, Patient Position: Sitting, Cuff Size: Normal)   Pulse 100   Temp 97.9 F (36.6 C) (Oral)   Resp 16   Ht 5\' 1"  (1.549 m)   Wt 173 lb 12.8 oz (78.8 kg)   LMP 01/27/2009   SpO2 97%   BMI 32.84 kg/m   BP Readings from Last 3 Encounters:  05/14/23 132/78  12/17/22 132/78  05/19/22 (!) 144/92    Wt Readings from Last 3 Encounters:  05/14/23 173 lb 12.8 oz (78.8 kg)  12/17/22 183 lb 9.6 oz (83.3 kg)  12/16/22 167 lb (75.8 kg)    Physical Exam Vitals reviewed.  Constitutional:      Appearance: Normal appearance.  HENT:     Nose: Nose normal.     Mouth/Throat:     Mouth: Mucous membranes are moist.  Eyes:     General: No scleral icterus.    Conjunctiva/sclera: Conjunctivae normal.  Cardiovascular:     Rate and Rhythm: Normal rate and regular rhythm.     Pulses: Normal pulses.     Heart sounds: No murmur heard.    No friction rub. No gallop.     Comments: EKG- NSR, 83 bpm No LVH, Q waves, or ST/T wave changes  Pulmonary:     Effort: Pulmonary effort is normal.     Breath sounds: No stridor. No wheezing, rhonchi or rales.  Abdominal:     General: Abdomen is flat.     Palpations: There is no mass.     Tenderness: There is no abdominal tenderness. There is no guarding.     Hernia: No hernia is present.  Musculoskeletal:     Cervical back: Neck supple.     Right lower leg: No edema.     Left lower leg: No edema.  Lymphadenopathy:     Cervical: No cervical adenopathy.  Skin:    General: Skin is warm and dry.  Neurological:     Mental Status: She is alert.     Cranial Nerves: Cranial nerve deficit present. No dysarthria.     Motor: No weakness.     Gait: Gait normal.   Psychiatric:        Attention and Perception: Attention normal.        Mood and Affect: Affect normal. Mood is anxious.        Speech: Speech normal.        Behavior: Behavior normal.        Thought Content: Thought content normal.        Cognition and Memory: Cognition normal.  Lab Results  Component Value Date   WBC 7.3 12/17/2022   HGB 13.6 12/17/2022   HCT 40.9 12/17/2022   PLT 205.0 12/17/2022   GLUCOSE 111 (H) 12/17/2022   CHOL 181 05/14/2023   TRIG 258.0 (H) 05/14/2023   HDL 76.90 05/14/2023   LDLDIRECT 193.0 05/19/2022   LDLCALC 52 05/14/2023   ALT 23 05/14/2023   AST 32 05/14/2023   NA 140 12/17/2022   K 4.2 12/17/2022   CL 103 12/17/2022   CREATININE 0.73 12/17/2022   BUN 18 12/17/2022   CO2 28 12/17/2022   TSH 1.95 05/19/2022   INR 1.1 (H) 12/17/2022   HGBA1C 5.9 05/14/2023   MICROALBUR 5.1 (H) 05/14/2023    CT CARDIAC SCORING ([facility] LOCATIONS ONLY) Result Date: 07/21/2022 CLINICAL DATA:  Hyperlipidemia * Tracking Code: FCC * EXAM: CT CARDIAC CORONARY ARTERY CALCIUM  SCORE TECHNIQUE: Non-contrast imaging through the heart was performed using prospective ECG gating. Image post processing was performed on an independent workstation, allowing for quantitative analysis of the heart and coronary arteries. Note that this exam targets the heart and the chest was not imaged in its entirety. COMPARISON:  None available FINDINGS: CORONARY CALCIUM  SCORES: Left Main: 0 LAD: 2.4 LCx: 2.8 RCA: 8 0 Total Agatston Score: 5.2 MESA database percentile: 49 AORTA MEASUREMENTS: Ascending Aorta: 3.6 cm Descending Aorta:2.7 cm OTHER FINDINGS: Heart is normal size. Aorta normal caliber. No adenopathy. No confluent airspace opacities or effusions. 2 mm subpleural nodule in the lingula on image 34. 3 mm subpleural nodule in the left lower lobe on image 35. No acute findings in the upper abdomen. Chest wall soft tissues are unremarkable. Bilateral breast implants partially visualized. No acute  bony abnormality. IMPRESSION: Total Agatston score: 5.2 [place] database percentile: 49 Small subpleural nodules in the left lung measuring maximally 3 mm. No follow-up needed if patient is low-risk (and has no known or suspected primary neoplasm). Non-contrast chest CT can be considered in 12 months if patient is high-risk. This recommendation follows the consensus statement: Guidelines for Management of Incidental Pulmonary Nodules Detected on CT Images: From the Fleischner Society 2017; Radiology 2017; 284:228-243. Electronically Signed   By: Janeece Mechanic M.D.   On: 07/21/2022 18:06    Assessment & Plan:   Hyperlipidemia LDL goal <130 - LDL goal achieved. Doing well on the statin  -     Lipid panel; Future -     Hepatic function panel; Future  Type 2 diabetes mellitus with other circulatory complication, without long-term current use of insulin  (HCC)- Blood sugar is well controlled. -     Hepatic function panel; Future -     Urinalysis, Routine w reflex microscopic; Future -     Microalbumin / creatinine urine ratio; Future -     Hemoglobin A1c; Future -     HM Diabetes Foot Exam -     Basic metabolic panel with GFR; Future -     Semaglutide  (1 MG/DOSE); Inject 1 mg as directed once a week.  Dispense: 9 mL; Refill: 0  Hypertension, essential, benign- BP is well controlled. EKG is negative for LVH. -     Urinalysis, Routine w reflex microscopic; Future -     EKG 12-Lead -     Basic metabolic panel with GFR; Future -     CBC with Differential/Platelet; Future  Screening for colon cancer -     Cologuard  Encounter for general adult medical examination with abnormal findings- Exam completed, labs reviewed, vaccines reviewed, cancer screenings  addressed, pt ed material was given.   GAD (generalized anxiety disorder) -     ALPRAZolam ; Take 1 tablet (0.5 mg total) by mouth 3 (three) times daily as needed for anxiety.  Dispense: 270 tablet; Refill: 0 -     FLUoxetine  HCl; Take 1 capsule (20 mg  total) by mouth daily.  Dispense: 90 capsule; Refill: 1     Follow-up: Return in about 6 months (around 11/13/2023).  Sandra Crouch, MD

## 2023-05-14 NOTE — Patient Instructions (Signed)

## 2023-05-15 MED ORDER — FLUOXETINE HCL 20 MG PO CAPS: 20.0000 mg | ORAL_CAPSULE | Freq: Every day | ORAL | 1 refills | Status: DC

## 2023-05-17 ENCOUNTER — Encounter: Payer: Self-pay | Admitting: Internal Medicine

## 2023-05-17 MED ORDER — SEMAGLUTIDE (1 MG/DOSE) 4 MG/3ML ~~LOC~~ SOPN: 1.0000 mg | PEN_INJECTOR | SUBCUTANEOUS | 0 refills | Status: DC

## 2023-06-01 LAB — COLOGUARD: COLOGUARD: NEGATIVE

## 2023-06-09 LAB — HM DIABETES EYE EXAM

## 2023-08-14 ENCOUNTER — Other Ambulatory Visit: Payer: Self-pay | Admitting: Internal Medicine

## 2023-08-14 DIAGNOSIS — E1159 Type 2 diabetes mellitus with other circulatory complications: Secondary | ICD-10-CM

## 2023-08-27 ENCOUNTER — Other Ambulatory Visit: Payer: Self-pay | Admitting: Internal Medicine

## 2023-08-27 NOTE — Telephone Encounter (Unsigned)
 Copied from CRM [id number]. Topic: Clinical - Medication Refill >> Aug 27, 2023 10:00 AM Burnard DEL wrote: Medication: dexlansoprazole  (DEXILANT ) 60 MG capsule  Has the patient contacted their pharmacy? Yes (Agent: If no, request that the patient contact the pharmacy for the refill. If patient does not wish to contact the pharmacy document the reason why and proceed with request.) (Agent: If yes, when and what did the pharmacy advise?)  This is the patient's preferred pharmacy:  [facility] - [place], [place] - [address]  Phone: [phone] Fax: [phone]     Is this the correct pharmacy for this prescription? Yes If no, delete pharmacy and type the correct one.   Has the prescription been filled recently? No  Is the patient out of the medication? No(10 days left)  Has the patient been seen for an appointment in the last year OR does the patient have an upcoming appointment? Yes  Can we respond through MyChart? Yes  Agent: Please be advised that Rx refills may take up to 3 business days. We ask that you follow-up with your pharmacy.

## 2023-09-01 ENCOUNTER — Encounter: Payer: Self-pay | Admitting: Internal Medicine

## 2023-09-02 ENCOUNTER — Other Ambulatory Visit: Payer: Self-pay | Admitting: Internal Medicine

## 2023-09-02 ENCOUNTER — Other Ambulatory Visit: Payer: Self-pay

## 2023-09-02 DIAGNOSIS — K219 Gastro-esophageal reflux disease without esophagitis: Secondary | ICD-10-CM

## 2023-09-02 MED ORDER — DEXLANSOPRAZOLE 60 MG PO CPDR: 60.0000 mg | DELAYED_RELEASE_CAPSULE | Freq: Every day | ORAL | 0 refills | Status: DC

## 2023-09-04 ENCOUNTER — Other Ambulatory Visit: Payer: Self-pay | Admitting: Internal Medicine

## 2023-09-04 DIAGNOSIS — F411 Generalized anxiety disorder: Secondary | ICD-10-CM

## 2023-10-10 ENCOUNTER — Other Ambulatory Visit: Payer: Self-pay | Admitting: Internal Medicine

## 2023-10-10 DIAGNOSIS — I10 Essential (primary) hypertension: Secondary | ICD-10-CM

## 2023-10-10 DIAGNOSIS — E785 Hyperlipidemia, unspecified: Secondary | ICD-10-CM

## 2023-11-18 ENCOUNTER — Other Ambulatory Visit: Payer: Self-pay | Admitting: Internal Medicine

## 2023-11-18 DIAGNOSIS — F411 Generalized anxiety disorder: Secondary | ICD-10-CM

## 2023-12-06 ENCOUNTER — Other Ambulatory Visit: Payer: Self-pay | Admitting: Internal Medicine

## 2023-12-06 DIAGNOSIS — K219 Gastro-esophageal reflux disease without esophagitis: Secondary | ICD-10-CM

## 2023-12-09 ENCOUNTER — Telehealth: Payer: Self-pay

## 2023-12-09 NOTE — Telephone Encounter (Signed)
 Copied from CRM [id number]. Topic: Clinical - Prescription Issue >> Dec 09, 2023 10:17 AM Macario HERO wrote: Reason for CRM: Patient called said her pharmacy sent a request for her medication: dexlansoprazole  (DEXILANT ) 60 MG capsule [[id number]]  and still have not heard from us , she is almost out. Only has 2 days left.

## 2023-12-10 ENCOUNTER — Telehealth: Payer: Self-pay

## 2023-12-10 NOTE — Telephone Encounter (Signed)
 Copied from CRM #[id number]. Topic: Clinical - Prescription Issue >> Dec 10, 2023 11:16 AM Alfonso ORN wrote: Reason for CRM: pt calling to f/u on refill request. Advised no recent update but advised by clinic that pcp likes to see pts every 6 months for medications and may need an appt. PT stated husband Monica Barnes has an appt 11/18 that she will come along for.   Advised cannot add her to appt but can f/u at this time.

## 2023-12-14 NOTE — Telephone Encounter (Signed)
 Medication has been refilled and patient has been scheduled.

## 2023-12-14 NOTE — Telephone Encounter (Signed)
 Patient has been scheduled and Dr Joshua did give the okay for me to double book.

## 2023-12-15 ENCOUNTER — Ambulatory Visit (INDEPENDENT_AMBULATORY_CARE_PROVIDER_SITE_OTHER): Admitting: Internal Medicine

## 2023-12-15 ENCOUNTER — Encounter: Payer: Self-pay | Admitting: Internal Medicine

## 2023-12-15 VITALS — BP 130/78 | HR 89 | Temp 97.8°F | Resp 16 | Ht 61.0 in | Wt 157.2 lb

## 2023-12-15 DIAGNOSIS — G2581 Restless legs syndrome: Secondary | ICD-10-CM | POA: Diagnosis not present

## 2023-12-15 DIAGNOSIS — E1159 Type 2 diabetes mellitus with other circulatory complications: Secondary | ICD-10-CM | POA: Diagnosis not present

## 2023-12-15 DIAGNOSIS — K7581 Nonalcoholic steatohepatitis (NASH): Secondary | ICD-10-CM | POA: Diagnosis not present

## 2023-12-15 DIAGNOSIS — Z7985 Long-term (current) use of injectable non-insulin antidiabetic drugs: Secondary | ICD-10-CM

## 2023-12-15 DIAGNOSIS — K219 Gastro-esophageal reflux disease without esophagitis: Secondary | ICD-10-CM

## 2023-12-15 DIAGNOSIS — E785 Hyperlipidemia, unspecified: Secondary | ICD-10-CM

## 2023-12-15 DIAGNOSIS — I10 Essential (primary) hypertension: Secondary | ICD-10-CM

## 2023-12-15 DIAGNOSIS — F411 Generalized anxiety disorder: Secondary | ICD-10-CM

## 2023-12-15 LAB — CBC WITH DIFFERENTIAL/PLATELET
Basophils Absolute: 0 K/uL (ref 0.0–0.1)
Basophils Relative: 0.1 % (ref 0.0–3.0)
Eosinophils Absolute: 0.1 K/uL (ref 0.0–0.7)
Eosinophils Relative: 1.2 % (ref 0.0–5.0)
HCT: 40.4 % (ref 36.0–46.0)
Hemoglobin: 13.7 g/dL (ref 12.0–15.0)
Lymphocytes Relative: 22.9 % (ref 12.0–46.0)
Lymphs Abs: 1.6 K/uL (ref 0.7–4.0)
MCHC: 33.9 g/dL (ref 30.0–36.0)
MCV: 96.6 fl (ref 78.0–100.0)
Monocytes Absolute: 0.5 K/uL (ref 0.1–1.0)
Monocytes Relative: 7.1 % (ref 3.0–12.0)
Neutro Abs: 4.9 K/uL (ref 1.4–7.7)
Neutrophils Relative %: 68.7 % (ref 43.0–77.0)
Platelets: 184 K/uL (ref 150.0–400.0)
RBC: 4.18 Mil/uL (ref 3.87–5.11)
RDW: 12.8 % (ref 11.5–15.5)
WBC: 7.1 K/uL (ref 4.0–10.5)

## 2023-12-15 LAB — BASIC METABOLIC PANEL WITH GFR
BUN: 19 mg/dL (ref 6–23)
CO2: 26 meq/L (ref 19–32)
Calcium: 9.6 mg/dL (ref 8.4–10.5)
Chloride: 103 meq/L (ref 96–112)
Creatinine, Ser: 0.77 mg/dL (ref 0.40–1.20)
GFR: 78.46 mL/min (ref >60.00)
Glucose, Bld: 102 mg/dL — ABNORMAL HIGH (ref 70–99)
Potassium: 4.1 meq/L (ref 3.5–5.1)
Sodium: 140 meq/L (ref 135–145)

## 2023-12-15 LAB — LIPID PANEL
Cholesterol: 171 mg/dL (ref 0–200)
HDL: 83.7 mg/dL (ref >39.00)
LDL Cholesterol: 47 mg/dL (ref 0–99)
NonHDL: 87.58
Total CHOL/HDL Ratio: 2
Triglycerides: 202 mg/dL — ABNORMAL HIGH (ref 0.0–149.0)
VLDL: 40.4 mg/dL — ABNORMAL HIGH (ref 0.0–40.0)

## 2023-12-15 LAB — URINALYSIS, ROUTINE W REFLEX MICROSCOPIC
Bilirubin Urine: NEGATIVE
Hgb urine dipstick: NEGATIVE
Nitrite: POSITIVE — AB
RBC / HPF: NONE SEEN (ref 0–?)
Specific Gravity, Urine: >=1.03 — AB (ref 1.000–1.030)
Total Protein, Urine: NEGATIVE
Urine Glucose: NEGATIVE
Urobilinogen, UA: 0.2 (ref 0.0–1.0)
pH: 5.5 (ref 5.0–8.0)

## 2023-12-15 LAB — MICROALBUMIN / CREATININE URINE RATIO
Creatinine,U: 187.2 mg/dL
Microalb Creat Ratio: 24.6 mg/g (ref 0.0–30.0)
Microalb, Ur: 4.6 mg/dL — ABNORMAL HIGH (ref 0.0–1.9)

## 2023-12-15 LAB — HEPATIC FUNCTION PANEL
ALT: 22 U/L (ref 0–35)
AST: 29 U/L (ref 0–37)
Albumin: 4.8 g/dL (ref 3.5–5.2)
Alkaline Phosphatase: 63 U/L (ref 39–117)
Bilirubin, Direct: 0.1 mg/dL (ref 0.0–0.3)
Total Bilirubin: 0.5 mg/dL (ref 0.2–1.2)
Total Protein: 7.4 g/dL (ref 6.0–8.3)

## 2023-12-15 LAB — IBC + FERRITIN
Ferritin: 355.2 ng/mL — ABNORMAL HIGH (ref 10.0–291.0)
Iron: 99 ug/dL (ref 42–145)
Saturation Ratios: 27.2 % (ref 20.0–50.0)
TIBC: 364 ug/dL (ref 250.0–450.0)
Transferrin: 260 mg/dL (ref 212.0–360.0)

## 2023-12-15 LAB — HEMOGLOBIN A1C: Hgb A1c MFr Bld: 5.7 % (ref 4.6–6.5)

## 2023-12-15 LAB — PROTIME-INR
INR: 1 ratio (ref 0.8–1.0)
Prothrombin Time: 11 s (ref 9.6–13.1)

## 2023-12-15 MED ORDER — NOVOFINE PEN NEEDLE 32G X 6 MM MISC: 1.0000 | 0 refills | Status: AC

## 2023-12-15 MED ORDER — ALPRAZOLAM 0.5 MG PO TABS: 0.5000 mg | ORAL_TABLET | Freq: Three times a day (TID) | ORAL | 0 refills | Status: AC | PRN

## 2023-12-15 MED ORDER — DEXLANSOPRAZOLE 60 MG PO CPDR: 60.0000 mg | DELAYED_RELEASE_CAPSULE | Freq: Every day | ORAL | 1 refills | Status: AC

## 2023-12-15 MED ORDER — FLUOXETINE HCL 20 MG PO CAPS: 20.0000 mg | ORAL_CAPSULE | Freq: Every day | ORAL | 1 refills | Status: AC

## 2023-12-15 MED ORDER — SEMAGLUTIDE (2 MG/DOSE) 8 MG/3ML ~~LOC~~ SOPN: 2.0000 mg | PEN_INJECTOR | SUBCUTANEOUS | 0 refills | Status: DC

## 2023-12-15 NOTE — Patient Instructions (Signed)

## 2023-12-15 NOTE — Progress Notes (Signed)
 "  Subjective:  Patient ID: Monica Barnes, female    DOB: 11-Jan-1956  Age: 68 y.o. MRN: [id number]  CC: Hypertension, Hyperlipidemia, Gastroesophageal Reflux, Diabetes, and Depression   HPI Monica Barnes presents for f/up ---  Discussed the use of AI scribe software for clinical note transcription with the patient, who gave verbal consent to proceed.  History of Present Illness Monica Barnes is a 68 year old female who presents for a follow-up regarding her diabetes management and anxiety.  She has been experiencing increased anxiety and tearfulness due to ongoing issues with a contractor, involving financial loss and incomplete home renovations. She denies panic attacks or other severe symptoms. Sleep is generally fine with the aid of Unisom , although she struggles with waking up upset due to the ongoing contractor issues.  She denies any symptoms of excessive thirst or urination related to her diabetes. She has been using Ozempic  at a dose of 1 mg and reports no side effects such as abdominal pain, nausea, vomiting, belching, or constipation. Since starting Ozempic  last November, she has lost approximately 30 pounds but has hit a plateau in her weight loss over the past month. She is 5 foot 1 and feels she should weigh less.  She remains as active as possible despite a history of a broken leg, engaging in activities such as climbing stairs and going on cruises. No chest pain, shortness of breath, dizziness, or lightheadedness during physical activity.  She lives full-time in a partially completed house in [place]. She had an eye exam in May in [place] and does not want to receive a flu or COVID vaccine.   Outpatient Medications Prior to Visit  Medication Sig Dispense Refill   B Complex-C-Folic Acid (STRESS B COMPLEX PO) Take 1 capsule by mouth daily.     carboxymethylcellulose (REFRESH PLUS) 0.5 % SOLN Place 1 drop into both eyes daily as needed (dry eyes).      cholecalciferol  (VITAMIN D3) 25 MCG (1000 UNIT) tablet Take 1,000 Units by mouth daily.     Cyanocobalamin  (B-12 SL) Place 1 tablet under the tongue daily.     doxylamine , Sleep, (UNISOM ) 25 MG tablet Take 25 mg by mouth at bedtime.     ferrous sulfate  325 (65 FE) MG tablet Take 325 mg by mouth daily with breakfast.     MAGNESIUM  PO Take 1 tablet by mouth daily.     Multiple Vitamin (MULTIVITAMIN WITH MINERALS) TABS tablet Take 1 tablet by mouth daily.     olmesartan  (BENICAR ) 20 MG tablet TAKE 1 TABLET BY MOUTH EVERY DAY 90 tablet 1   rosuvastatin  (CRESTOR ) 20 MG tablet TAKE 1 TABLET BY MOUTH EVERY DAY 90 tablet 1   TURMERIC PO Take 1 capsule by mouth daily.     Vitamin D -Vitamin K (VITAMIN K2-VITAMIN D3 PO) Take 1 capsule by mouth daily.     vitamin E  180 MG (400 UNITS) capsule Take 400 Units by mouth daily.     zinc  gluconate 50 MG tablet Take 50 mg by mouth daily.     ALPRAZolam  (XANAX ) 0.5 MG tablet TAKE 1 TABLET (0.5 MG TOTAL) BY MOUTH 3 (THREE) TIMES DAILY AS NEEDED FOR ANXIETY. 270 tablet 0   dexlansoprazole  (DEXILANT ) 60 MG capsule TAKE 1 CAPSULE BY MOUTH EVERY DAY 30 capsule 0   FLUoxetine  (PROZAC ) 20 MG capsule Take 1 capsule (20 mg total) by mouth daily. 90 capsule 1   Semaglutide , 1 MG/DOSE, (OZEMPIC , 1 MG/DOSE,) 4 MG/3ML SOPN INJECT 1 MG ONCE  A WEEK AS DIRECTED 9 mL 0   No facility-administered medications prior to visit.    ROS Review of Systems  Constitutional:  Negative for appetite change, chills, diaphoresis, fatigue and fever.  HENT: Negative.  Negative for facial swelling.   Eyes: Negative.   Respiratory: Negative.  Negative for cough, chest tightness, shortness of breath and wheezing.   Cardiovascular:  Negative for chest pain, palpitations and leg swelling.  Gastrointestinal: Negative.  Negative for abdominal pain, constipation, diarrhea, nausea and vomiting.  Genitourinary: Negative.  Negative for difficulty urinating, dysuria, frequency, hematuria, pelvic pain and  urgency.  Musculoskeletal: Negative.  Negative for arthralgias and myalgias.  Skin: Negative.   Neurological:  Positive for facial asymmetry. Negative for dizziness, speech difficulty, weakness and numbness.  Hematological:  Negative for adenopathy. Does not bruise/bleed easily.  Psychiatric/Behavioral:  Positive for dysphoric mood and sleep disturbance. Negative for confusion, decreased concentration, self-injury and suicidal ideas. The patient is nervous/anxious. The patient is not hyperactive.     Objective:  BP 130/78 (BP Location: Left Arm, Patient Position: Sitting)   Pulse 89   Temp 97.8 F (36.6 C) (Temporal)   Resp 16   Ht 5' 1 (1.549 m)   Wt 157 lb 3.2 oz (71.3 kg)   LMP 01/27/2009   SpO2 96%   BMI 29.70 kg/m   BP Readings from Last 3 Encounters:  12/15/23 130/78  05/14/23 132/78  12/17/22 132/78    Wt Readings from Last 3 Encounters:  12/15/23 157 lb 3.2 oz (71.3 kg)  05/14/23 173 lb 12.8 oz (78.8 kg)  12/17/22 183 lb 9.6 oz (83.3 kg)    Physical Exam Vitals reviewed.  Constitutional:      General: She is not in acute distress.    Appearance: Normal appearance. She is not toxic-appearing or diaphoretic.  HENT:     Nose: Nose normal.     Mouth/Throat:     Mouth: Mucous membranes are moist.  Eyes:     General: No scleral icterus.    Conjunctiva/sclera: Conjunctivae normal.  Cardiovascular:     Rate and Rhythm: Normal rate and regular rhythm.     Heart sounds: No murmur heard.    No friction rub. No gallop.  Pulmonary:     Effort: Pulmonary effort is normal.     Breath sounds: No stridor. No wheezing, rhonchi or rales.  Abdominal:     General: Abdomen is flat.     Palpations: There is no mass.     Tenderness: There is no abdominal tenderness. There is no guarding.     Hernia: No hernia is present.  Musculoskeletal:        General: Normal range of motion.     Cervical back: Neck supple.     Right lower leg: No edema.     Left lower leg: No edema.   Lymphadenopathy:     Cervical: No cervical adenopathy.  Skin:    General: Skin is warm and dry.     Findings: No rash.  Neurological:     Mental Status: She is alert. Mental status is at baseline.     Cranial Nerves: Cranial nerve deficit present.     Sensory: Sensation is intact.     Motor: Motor function is intact. No weakness.     Coordination: Coordination is intact.     Gait: Gait is intact. Gait normal.     Deep Tendon Reflexes: Reflexes normal.  Psychiatric:        Attention and Perception:  She is inattentive.        Mood and Affect: Mood is anxious. Mood is not depressed. Affect is not tearful.        Speech: Speech normal.        Behavior: Behavior normal.        Thought Content: Thought content normal.        Cognition and Memory: Cognition normal.     Lab Results  Component Value Date   WBC 7.3 12/17/2022   HGB 13.6 12/17/2022   HCT 40.9 12/17/2022   PLT 205.0 12/17/2022   GLUCOSE 111 (H) 12/17/2022   CHOL 181 05/14/2023   TRIG 258.0 (H) 05/14/2023   HDL 76.90 05/14/2023   LDLDIRECT 193.0 05/19/2022   LDLCALC 52 05/14/2023   ALT 23 05/14/2023   AST 32 05/14/2023   NA 140 12/17/2022   K 4.2 12/17/2022   CL 103 12/17/2022   CREATININE 0.73 12/17/2022   BUN 18 12/17/2022   CO2 28 12/17/2022   TSH 1.95 05/19/2022   INR 1.1 (H) 12/17/2022   HGBA1C 5.9 05/14/2023   MICROALBUR 5.1 (H) 05/14/2023    CT CARDIAC SCORING ([facility] LOCATIONS ONLY) Result Date: 07/21/2022 CLINICAL DATA:  Hyperlipidemia * Tracking Code: FCC * EXAM: CT CARDIAC CORONARY ARTERY CALCIUM  SCORE TECHNIQUE: Non-contrast imaging through the heart was performed using prospective ECG gating. Image post processing was performed on an independent workstation, allowing for quantitative analysis of the heart and coronary arteries. Note that this exam targets the heart and the chest was not imaged in its entirety. COMPARISON:  None available FINDINGS: CORONARY CALCIUM  SCORES: Left Main: 0 LAD: 2.4 LCx:  2.8 RCA: 8 0 Total Agatston Score: 5.2 MESA database percentile: 49 AORTA MEASUREMENTS: Ascending Aorta: 3.6 cm Descending Aorta:2.7 cm OTHER FINDINGS: Heart is normal size. Aorta normal caliber. No adenopathy. No confluent airspace opacities or effusions. 2 mm subpleural nodule in the lingula on image 34. 3 mm subpleural nodule in the left lower lobe on image 35. No acute findings in the upper abdomen. Chest wall soft tissues are unremarkable. Bilateral breast implants partially visualized. No acute bony abnormality. IMPRESSION: Total Agatston score: 5.2 [place] database percentile: 49 Small subpleural nodules in the left lung measuring maximally 3 mm. No follow-up needed if patient is low-risk (and has no known or suspected primary neoplasm). Non-contrast chest CT can be considered in 12 months if patient is high-risk. This recommendation follows the consensus statement: Guidelines for Management of Incidental Pulmonary Nodules Detected on CT Images: From the Fleischner Society 2017; Radiology 2017; 284:228-243. Electronically Signed   By: Franky Crease M.D.   On: 07/21/2022 18:06   Fibrosis 4 Score = 2.32  Fib-4 interpretation is not validated for people under 35 or over 63 years of age. However, scores under 2.0 are generally considered low risk.   Assessment & Plan:  NASH (nonalcoholic steatohepatitis) -     Hepatic function panel; Future -     Protime-INR; Future  Hypertension, essential, benign- BP is well controlled. -     Basic metabolic panel with GFR; Future -     CBC with Differential/Platelet; Future -     Urinalysis, Routine w reflex microscopic; Future  Type 2 diabetes mellitus with other circulatory complication, without long-term current use of insulin  (HCC)- Blood sugar is well controlled. -     Basic metabolic panel with GFR; Future -     Hemoglobin A1c; Future -     Urinalysis, Routine w reflex microscopic; Future -  Microalbumin / creatinine urine ratio; Future -      Semaglutide  (2 MG/DOSE); Inject 2 mg as directed once a week.  Dispense: 9 mL; Refill: 0 -     Novofine Pen Needle; 1 Act by Does not apply route once a week.  Dispense: 50 each; Refill: 0  Hyperlipidemia LDL goal <130- LDL goal achieved. Doing well on the statin  -     Lipid panel; Future  Restless legs -     CBC with Differential/Platelet; Future -     IBC + Ferritin; Future  GERD without esophagitis -     Dexlansoprazole ; Take 1 capsule (60 mg total) by mouth daily.  Dispense: 90 capsule; Refill: 1  GAD (generalized anxiety disorder) -     FLUoxetine  HCl; Take 1 capsule (20 mg total) by mouth daily.  Dispense: 90 capsule; Refill: 1 -     ALPRAZolam ; Take 1 tablet (0.5 mg total) by mouth 3 (three) times daily as needed for anxiety.  Dispense: 270 tablet; Refill: 0     Follow-up: Return in about 3 months (around 03/16/2024).  Debby Molt, MD "

## 2023-12-16 ENCOUNTER — Ambulatory Visit: Payer: Self-pay | Admitting: Internal Medicine

## 2024-01-12 ENCOUNTER — Ambulatory Visit

## 2024-01-12 VITALS — Ht 61.0 in | Wt 155.0 lb

## 2024-01-12 DIAGNOSIS — Z0000 Encounter for general adult medical examination without abnormal findings: Secondary | ICD-10-CM

## 2024-01-12 NOTE — Progress Notes (Signed)
 Chief Complaint  Patient presents with   Medicare Wellness     Subjective:   Monica Barnes is a 68 y.o. female who presents for a Medicare Annual Wellness Visit.  Visit info / Clinical Intake: Medicare Wellness Visit Type:: Subsequent Annual Wellness Visit Persons participating in visit and providing information:: patient Medicare Wellness Visit Mode:: Telephone If telephone:: video declined Since this visit was completed virtually, some vitals may be partially provided or unavailable. Missing vitals are due to the limitations of the virtual format.: Documented vitals are patient reported If Telephone or Video please confirm:: I connected with patient using audio/video enable telemedicine. I verified patient identity with two identifiers, discussed telehealth limitations, and patient agreed to proceed. Patient Location:: Home Provider Location:: Office Pre-visit prep was completed: yes AWV questionnaire completed by patient prior to visit?: yes Date:: 01/12/24 Living arrangements:: lives with spouse/significant other Patient's Overall Health Status Rating: very good Typical amount of pain: none Does pain affect daily life?: no Are you currently prescribed opioids?: no  Dietary Habits and Nutritional Risks How many meals a day?: 3 Eats fruit and vegetables daily?: yes Most meals are obtained by: preparing own meals In the last 2 weeks, have you had any of the following?: none Diabetic:: (!) yes Any non-healing wounds?: no How often do you check your BS?: 0 Would you like to be referred to a Nutritionist or for Diabetic Management? : no  Functional Status Activities of Daily Living (to include ambulation/medication): Independent Ambulation: Independent with device- listed below Home Assistive Devices/Equipment: Eyeglasses Medication Administration: Independent Home Management (perform basic housework or laundry): Independent Manage your own finances?: yes Primary  transportation is: family / friends Concerns about vision?: no *vision screening is required for WTM* Concerns about hearing?: no  Fall Screening Falls in the past year?: 0 Number of falls in past year: 0 Was there an injury with Fall?: 0 Fall Risk Category Calculator: 0 Patient Fall Risk Level: Low Fall Risk  Fall Risk Patient at Risk for Falls Due to: No Fall Risks Fall risk Follow up: Falls evaluation completed; Falls prevention discussed  Home and Transportation Safety: All rugs have non-skid backing?: yes All stairs or steps have railings?: yes Grab bars in the bathtub or shower?: (!) no Have non-skid surface in bathtub or shower?: (!) no Good home lighting?: yes Regular seat belt use?: yes Hospital stays in the last year:: no  Cognitive Assessment Difficulty concentrating, remembering, or making decisions? : no Will 6CIT or Mini Cog be Completed: yes What year is it?: 0 points What month is it?: 0 points Give patient an address phrase to remember (5 components): [address] [place], [place] (For Healthcare) Does Patient Have a Medical Advance Directive?: Yes Does patient want to make changes to medical advance directive?: Yes (Inpatient - patient requests chaplain consult to change a medical advance directive) Type of Advance Directive: Living will Copy of Living Will in Chart?: No - copy requested  Reviewed/Updated  Reviewed/Updated: Reviewed All (Medical, Surgical, Family, Medications, Allergies, Care Teams, Patient Goals)    Allergies (verified) Penicillins and Latex   Current Medications (verified) Outpatient Encounter Medications as of 01/12/2024  Medication Sig   ALPRAZolam  (XANAX ) 0.5 MG tablet Take 1 tablet (0.5 mg total) by mouth 3 (three) times daily as needed for anxiety.   B Complex-C-Folic Acid (STRESS B COMPLEX PO) Take 1 capsule by mouth daily.   carboxymethylcellulose (REFRESH PLUS) 0.5 % SOLN Place 1 drop into both eyes daily  as needed (dry eyes).   cholecalciferol  (VITAMIN D3) 25 MCG (1000 UNIT) tablet Take 1,000 Units by mouth daily.   Cyanocobalamin  (B-12 SL) Place 1 tablet under the tongue daily.   dexlansoprazole  (DEXILANT ) 60 MG capsule Take 1 capsule (60 mg total) by mouth daily.   doxylamine , Sleep, (UNISOM ) 25 MG tablet Take 25 mg by mouth at bedtime.   ferrous sulfate  325 (65 FE) MG tablet Take 325 mg by mouth daily with breakfast.   FLUoxetine  (PROZAC ) 20 MG capsule Take 1 capsule (20 mg total) by mouth daily.   Insulin  Pen Needle (NOVOFINE PEN NEEDLE) 32G X 6 MM MISC 1 Act by Does not apply route once a week.   MAGNESIUM  PO Take 1 tablet by mouth daily.   Multiple Vitamin (MULTIVITAMIN WITH MINERALS) TABS tablet Take 1 tablet by mouth daily.   olmesartan  (BENICAR ) 20 MG tablet TAKE 1 TABLET BY MOUTH EVERY DAY   rosuvastatin  (CRESTOR ) 20 MG tablet TAKE 1 TABLET BY MOUTH EVERY DAY   Semaglutide , 2 MG/DOSE, 8 MG/3ML SOPN Inject 2 mg as directed once a week.   TURMERIC PO Take 1 capsule by mouth daily.   Vitamin D -Vitamin K (VITAMIN K2-VITAMIN D3 PO) Take 1 capsule by mouth daily.   zinc  gluconate 50 MG tablet Take 50 mg by mouth daily.   No facility-administered encounter medications on file as of 01/12/2024.    History: Past Medical History:  Diagnosis Date   Allergy 1980   Anemia    Anxiety    Asthma    Complication of anesthesia    post-operative N/V   COPD (chronic obstructive pulmonary disease) (HCC)    Depression    Genital warts    GERD (gastroesophageal reflux disease)    Headache(784.0)    Heart murmur    may have been told she had a murmur in 1970's, but not recently (07/01/21)   PONV (postoperative nausea and vomiting)    Restless legs 09/20/2010   Upper airway resistance syndrome 09/03/2010   Past Surgical History:  Procedure Laterality Date   APPENDECTOMY  01/27/1958   BREAST SURGERY  2019   COSMETIC SURGERY  1984   breast augmentation   FRACTURE SURGERY  2023   Broken  leg   PAROTIDECTOMY Right 07/08/2021   Procedure: PAROTIDECTOMY WITH FACIAL NERVE DISSECTION;  Surgeon: Jesus Oliphant, MD;  Location: [facility] OR;  Service: ENT;  Laterality: Right;   removal birth mark  1961 and 1963   left arm   tibia fx Right    TUBAL LIGATION  01/27/1981   Family History  Problem Relation Age of Onset   Throat cancer Father    Liver cancer Father    Alcohol  abuse Father    Hypertension Father    Pancreatic cancer Father    Cancer Father    Early death Father    Emphysema Mother    COPD Mother    Asthma Mother    Alcohol  abuse Mother    Miscarriages / Stillbirths Mother    Alcohol  abuse Other    Drug abuse Other    Heart disease Maternal Grandmother    Lung cancer Paternal Uncle    Prostate cancer Maternal Grandfather    Anxiety disorder Maternal Aunt    Depression Maternal Aunt    Cancer Brother    Early death Brother    Early death Daughter    Colon cancer Neg Hx    Stomach cancer Neg Hx    Stroke Neg Hx  Hyperlipidemia Neg Hx    Social History   Occupational History   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    Employer: [employer]  Tobacco Use   Smoking status: Former    Current packs/day: 0.00    Average packs/day: 1 pack/day for 36.0 years (36.0 ttl pk-yrs)    Types: Cigarettes    Start date: 01/28/1984    Quit date: 01/28/2020    Years since quitting: 3.9   Smokeless tobacco: Never   Tobacco comments:    smokes 1 pack cigarettes a month  Vaping Use   Vaping status: Never Used  Substance and Sexual Activity   Alcohol  use: Yes    Alcohol /week: 10.0 standard drinks of alcohol     Types: 10 Glasses of wine per week    Comment: socially   Drug use: No   Sexual activity: Yes    Birth control/protection: Post-menopausal   Tobacco Counseling Counseling given: Yes Tobacco comments: smokes 1 pack cigarettes a month  SDOH Screenings   Food Insecurity: No Food Insecurity (01/12/2024)  Housing: Unknown (01/12/2024)   Transportation Needs: No Transportation Needs (01/12/2024)  Utilities: Not At Risk (01/12/2024)  Alcohol  Screen: Low Risk (01/12/2024)  Depression (PHQ2-9): Low Risk (01/12/2024)  Financial Resource Strain: Low Risk (01/12/2024)  Physical Activity: Insufficiently Active (01/12/2024)  Social Connections: Moderately Integrated (01/12/2024)  Stress: No Stress Concern Present (01/12/2024)  Tobacco Use: Medium Risk (01/12/2024)  Health Literacy: Adequate Health Literacy (01/12/2024)   See flowsheets for full screening details  Depression Screen PHQ 2 & 9 Depression Scale- Over the past 2 weeks, how often have you been bothered by any of the following problems? Little interest or pleasure in doing things: 0 Feeling down, depressed, or hopeless (PHQ Adolescent also includes...irritable): 0 PHQ-2 Total Score: 0 Trouble falling or staying asleep, or sleeping too much: 0 Feeling tired or having little energy: 0 Poor appetite or overeating (PHQ Adolescent also includes...weight loss): 0 Feeling bad about yourself - or that you are a failure or have let yourself or your family down: 0 Trouble concentrating on things, such as reading the newspaper or watching television (PHQ Adolescent also includes...like school work): 0 Moving or speaking so slowly that other people could have noticed. Or the opposite - being so fidgety or restless that you have been moving around a lot more than usual: 0 Thoughts that you would be better off dead, or of hurting yourself in some way: 0 PHQ-9 Total Score: 0 If you checked off any problems, how difficult have these problems made it for you to do your work, take care of things at home, or get along with other people?: Not difficult at all     Goals Addressed               This Visit's Progress     Patient Stated (pt-stated)        Patient stated she plans to continue watching her diet - want to lose more weight             Objective:    Today's  Vitals   01/12/24 1347  Weight: 155 lb (70.3 kg)  Height: 5' 1 (1.549 m)   Body mass index is 29.29 kg/m.  Hearing/Vision screen Hearing Screening - Comments:: Denies hearing difficulties   Vision Screening - Comments:: Wears rx glasses - up to date with routine eye exams with [facility] in [place], [place] Immunizations and Health Maintenance Health Maintenance  Topic  Date Due   COVID-19 Vaccine (1 - 2025-26 season) Never done   Pneumococcal Vaccine: 50+ Years (3 of 3 - PCV20 or PCV21) 04/10/2024   Zoster Vaccines- Shingrix (1 of 2) 03/16/2024 (Originally 02/04/2004)   Influenza Vaccine  04/26/2024 (Originally 08/28/2023)   FOOT EXAM  05/13/2024   Mammogram  05/15/2024   OPHTHALMOLOGY EXAM  06/08/2024   HEMOGLOBIN A1C  06/13/2024   Diabetic kidney evaluation - eGFR measurement  12/14/2024   Diabetic kidney evaluation - Urine ACR  12/14/2024   Medicare Annual Wellness (AWV)  01/11/2025   DTaP/Tdap/Td (3 - Td or Tdap) 10/21/2025   Fecal DNA (Cologuard)  05/26/2026   Bone Density Scan  Completed   Hepatitis C Screening  Completed   Meningococcal B Vaccine  Aged Out        Assessment/Plan:  This is a routine wellness examination for Leia.  I have recommended that this patient have a immunization for Influenza and Pneumonia  but she declines at this time. I have discussed the risks and benefits of this procedure with her. The patient verbalizes understanding.   Patient Care Team: Joshua Debby CROME, MD as PCP - General (Internal Medicine) Rosalynn LELON Ingle, MD (Inactive) (Obstetrics and Gynecology)  I have personally reviewed and noted the following in the patients chart:   Medical and social history Use of alcohol , tobacco or illicit drugs  Current medications and supplements including opioid prescriptions. Functional ability and status Nutritional status Physical activity Advanced directives List of other physicians Hospitalizations, surgeries, and ER visits in previous 12  months Vitals Screenings to include cognitive, depression, and falls Referrals and appointments  No orders of the defined types were placed in this encounter.  In addition, I have reviewed and discussed with patient certain preventive protocols, quality metrics, and best practice recommendations. A written personalized care plan for preventive services as well as general preventive health recommendations were provided to patient.   Verdie CHRISTELLA Saba, CMA   01/12/2024   Return in 1 year (on 01/11/2025).  After Visit Summary: (MyChart) Due to this being a telephonic visit, the after visit summary with patients personalized plan was offered to patient via MyChart   Nurse Notes: Appointment(s) made: (scheduled 2026 AWV/CPE appts)

## 2024-01-12 NOTE — Patient Instructions (Signed)
 Ms. Pulse,  Thank you for taking the time for your Medicare Wellness Visit. I appreciate your continued commitment to your health goals. Please review the care plan we discussed, and feel free to reach out if I can assist you further.  Please note that Annual Wellness Visits do not include a physical exam. Some assessments may be limited, especially if the visit was conducted virtually. If needed, we may recommend an in-person follow-up with your provider.  Ongoing Care Seeing your primary care provider every 3 to 6 months helps us  monitor your health and provide consistent, personalized care.   Referrals If a referral was made during today's visit and you haven't received any updates within two weeks, please contact the referred provider directly to check on the status.  Recommended Screenings:  Health Maintenance  Topic Date Due   COVID-19 Vaccine (1 - 2025-26 season) Never done   Pneumococcal Vaccine for age over 50 (3 of 3 - PCV20 or PCV21) 04/10/2024   Zoster (Shingles) Vaccine (1 of 2) 03/16/2024*   Flu Shot  04/26/2024*   Complete foot exam   05/13/2024   Breast Cancer Screening  05/15/2024   Eye exam for diabetics  06/08/2024   Hemoglobin A1C  06/13/2024   Yearly kidney function blood test for diabetes  12/14/2024   Yearly kidney health urinalysis for diabetes  12/14/2024   Medicare Annual Wellness Visit  01/11/2025   DTaP/Tdap/Td vaccine (3 - Td or Tdap) 10/21/2025   Cologuard (Stool DNA test)  05/26/2026   Osteoporosis screening with Bone Density Scan  Completed   Hepatitis C Screening  Completed   Meningitis B Vaccine  Aged Out  *Topic was postponed. The date shown is not the original due date.       01/12/2024    1:50 PM  Advanced Directives  Does Patient Have a Medical Advance Directive? Yes  Type of Advance Directive Living will  Does patient want to make changes to medical advance directive? Yes (Inpatient - patient requests chaplain consult to change a medical  advance directive)    Vision: Annual vision screenings are recommended for early detection of glaucoma, cataracts, and diabetic retinopathy. These exams can also reveal signs of chronic conditions such as diabetes and high blood pressure.  Dental: Annual dental screenings help detect early signs of oral cancer, gum disease, and other conditions linked to overall health, including heart disease and diabetes.

## 2024-01-12 NOTE — Progress Notes (Signed)
 Chief Complaint  Patient presents with   Medicare Wellness     Subjective:   Monica Barnes is a 68 y.o. female who presents for a Medicare Annual Wellness Visit.  Visit info / Clinical Intake: Medicare Wellness Visit Type:: Subsequent Annual Wellness Visit Persons participating in visit and providing information:: patient Medicare Wellness Visit Mode:: Telephone If telephone:: video declined Since this visit was completed virtually, some vitals may be partially provided or unavailable. Missing vitals are due to the limitations of the virtual format.: Documented vitals are patient reported If Telephone or Video please confirm:: I connected with patient using audio/video enable telemedicine. I verified patient identity with two identifiers, discussed telehealth limitations, and patient agreed to proceed. Patient Location:: Home Provider Location:: Office Pre-visit prep was completed: yes AWV questionnaire completed by patient prior to visit?: yes Date:: 01/12/24 Living arrangements:: lives with spouse/significant other Patient's Overall Health Status Rating: very good Typical amount of pain: none Does pain affect daily life?: no Are you currently prescribed opioids?: no  Dietary Habits and Nutritional Risks How many meals a day?: 3 Eats fruit and vegetables daily?: yes Most meals are obtained by: preparing own meals In the last 2 weeks, have you had any of the following?: none Diabetic:: (!) yes Any non-healing wounds?: no How often do you check your BS?: 0 Would you like to be referred to a Nutritionist or for Diabetic Management? : no  Functional Status Activities of Daily Living (to include ambulation/medication): Independent Ambulation: Independent with device- listed below Home Assistive Devices/Equipment: Eyeglasses Medication Administration: Independent Home Management (perform basic housework or laundry): Independent Manage your own finances?: yes Primary  transportation is: family / friends Concerns about vision?: no *vision screening is required for WTM* Concerns about hearing?: no  Fall Screening Falls in the past year?: 0 Number of falls in past year: 0 Was there an injury with Fall?: 0 Fall Risk Category Calculator: 0 Patient Fall Risk Level: Low Fall Risk  Fall Risk Patient at Risk for Falls Due to: No Fall Risks Fall risk Follow up: Falls evaluation completed; Falls prevention discussed  Home and Transportation Safety: All rugs have non-skid backing?: yes All stairs or steps have railings?: yes Grab bars in the bathtub or shower?: (!) no Have non-skid surface in bathtub or shower?: (!) no Good home lighting?: yes Regular seat belt use?: yes Hospital stays in the last year:: no  Cognitive Assessment Difficulty concentrating, remembering, or making decisions? : no Will 6CIT or Mini Cog be Completed: yes What year is it?: 0 points What month is it?: 0 points Give patient an address phrase to remember (5 components): [address] [place], [place] [place] ([place] [employer]) Does Patient Have a Medical Advance Directive?: Yes Does patient want to make changes to medical advance directive?: Yes (Inpatient - patient requests chaplain consult to change a medical advance directive) Type of Advance Directive: Living will Copy of Living Will in Chart?: No - copy requested  Reviewed/Updated  Reviewed/Updated: Reviewed All (Medical, Surgical, Family, Medications, Allergies, Care Teams, Patient Goals)    Allergies (verified) Penicillins and Latex   Current Medications (verified) Outpatient Encounter Medications as of 01/12/2024  Medication Sig   ALPRAZolam  (XANAX ) 0.5 MG tablet Take 1 tablet (0.5 mg total) by mouth 3 (three) times daily as needed for anxiety.   B Complex-C-Folic Acid (STRESS B COMPLEX PO) Take 1 capsule by mouth daily.   carboxymethylcellulose (REFRESH PLUS) 0.5 % SOLN Place 1 drop into both eyes daily  as needed (dry eyes).   cholecalciferol  (VITAMIN D3) 25 MCG (1000 UNIT) tablet Take 1,000 Units by mouth daily.   Cyanocobalamin  (B-12 SL) Place 1 tablet under the tongue daily.   dexlansoprazole  (DEXILANT ) 60 MG capsule Take 1 capsule (60 mg total) by mouth daily.   doxylamine , Sleep, (UNISOM ) 25 MG tablet Take 25 mg by mouth at bedtime.   ferrous sulfate  325 (65 FE) MG tablet Take 325 mg by mouth daily with breakfast.   FLUoxetine  (PROZAC ) 20 MG capsule Take 1 capsule (20 mg total) by mouth daily.   Insulin  Pen Needle (NOVOFINE PEN NEEDLE) 32G X 6 MM MISC 1 Act by Does not apply route once a week.   MAGNESIUM  PO Take 1 tablet by mouth daily.   Multiple Vitamin (MULTIVITAMIN WITH MINERALS) TABS tablet Take 1 tablet by mouth daily.   olmesartan  (BENICAR ) 20 MG tablet TAKE 1 TABLET BY MOUTH EVERY DAY   rosuvastatin  (CRESTOR ) 20 MG tablet TAKE 1 TABLET BY MOUTH EVERY DAY   Semaglutide , 2 MG/DOSE, 8 MG/3ML SOPN Inject 2 mg as directed once a week.   TURMERIC PO Take 1 capsule by mouth daily.   Vitamin D -Vitamin K (VITAMIN K2-VITAMIN D3 PO) Take 1 capsule by mouth daily.   zinc  gluconate 50 MG tablet Take 50 mg by mouth daily.   No facility-administered encounter medications on file as of 01/12/2024.    History: Past Medical History:  Diagnosis Date   Allergy 1980   Anemia    Anxiety    Asthma    Complication of anesthesia    post-operative N/V   COPD (chronic obstructive pulmonary disease) (HCC)    Depression    Genital warts    GERD (gastroesophageal reflux disease)    Headache(784.0)    Heart murmur    may have been told she had a murmur in 1970's, but not recently (07/01/21)   PONV (postoperative nausea and vomiting)    Restless legs 09/20/2010   Upper airway resistance syndrome 09/03/2010   Past Surgical History:  Procedure Laterality Date   APPENDECTOMY  01/27/1958   BREAST SURGERY  2019   COSMETIC SURGERY  1984   breast augmentation   FRACTURE SURGERY  2023   Broken  leg   PAROTIDECTOMY Right 07/08/2021   Procedure: PAROTIDECTOMY WITH FACIAL NERVE DISSECTION;  Surgeon: Jesus Oliphant, MD;  Location: [facility] OR;  Service: ENT;  Laterality: Right;   removal birth mark  1961 and 1963   left arm   tibia fx Right    TUBAL LIGATION  01/27/1981   Family History  Problem Relation Age of Onset   Throat cancer Father    Liver cancer Father    Alcohol  abuse Father    Hypertension Father    Pancreatic cancer Father    Cancer Father    Early death Father    Emphysema Mother    COPD Mother    Asthma Mother    Alcohol  abuse Mother    Miscarriages / Stillbirths Mother    Alcohol  abuse Other    Drug abuse Other    Heart disease Maternal Grandmother    Lung cancer Paternal Uncle    Prostate cancer Maternal Grandfather    Anxiety disorder Maternal Aunt    Depression Maternal Aunt    Cancer Brother    Early death Brother    Early death Daughter    Colon cancer Neg Hx    Stomach cancer Neg Hx    Stroke Neg Hx  Hyperlipidemia Neg Hx    Social History   Occupational History   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    Employer: [employer]  Tobacco Use   Smoking status: Former    Current packs/day: 0.00    Average packs/day: 1.0 packs/day    Types: Cigarettes    Quit date: 01/28/2020    Years since quitting: 3.9   Smokeless tobacco: Never   Tobacco comments:    smokes 1 pack cigarettes a month  Vaping Use   Vaping status: Never Used  Substance and Sexual Activity   Alcohol  use: Yes    Alcohol /week: 10.0 standard drinks of alcohol     Types: 10 Glasses of wine per week    Comment: socially   Drug use: No   Sexual activity: Yes    Birth control/protection: Post-menopausal   Tobacco Counseling Counseling given: Yes Tobacco comments: smokes 1 pack cigarettes a month  SDOH Screenings   Food Insecurity: No Food Insecurity (01/12/2024)  Housing: Unknown (01/12/2024)  Transportation Needs: No Transportation Needs  (01/12/2024)  Utilities: Not At Risk (01/12/2024)  Alcohol  Screen: Low Risk (01/12/2024)  Depression (PHQ2-9): Low Risk (01/12/2024)  Financial Resource Strain: Low Risk (01/12/2024)  Physical Activity: Insufficiently Active (01/12/2024)  Social Connections: Moderately Integrated (01/12/2024)  Stress: No Stress Concern Present (01/12/2024)  Tobacco Use: Medium Risk (01/12/2024)  Health Literacy: Adequate Health Literacy (01/12/2024)   See flowsheets for full screening details  Depression Screen PHQ 2 & 9 Depression Scale- Over the past 2 weeks, how often have you been bothered by any of the following problems? Little interest or pleasure in doing things: 0 Feeling down, depressed, or hopeless (PHQ Adolescent also includes...irritable): 0 PHQ-2 Total Score: 0 Trouble falling or staying asleep, or sleeping too much: 0 Feeling tired or having little energy: 0 Poor appetite or overeating (PHQ Adolescent also includes...weight loss): 0 Feeling bad about yourself - or that you are a failure or have let yourself or your family down: 0 Trouble concentrating on things, such as reading the newspaper or watching television (PHQ Adolescent also includes...like school work): 0 Moving or speaking so slowly that other people could have noticed. Or the opposite - being so fidgety or restless that you have been moving around a lot more than usual: 0 Thoughts that you would be better off dead, or of hurting yourself in some way: 0 PHQ-9 Total Score: 0 If you checked off any problems, how difficult have these problems made it for you to do your work, take care of things at home, or get along with other people?: Not difficult at all     Goals Addressed               This Visit's Progress     Patient Stated (pt-stated)        Patient stated she plans to continue watching her diet - want to lose more weight             Objective:    Today's Vitals   01/12/24 1347  Weight: 155 lb (70.3 kg)   Height: 5' 1 (1.549 m)   Body mass index is 29.29 kg/m.  Hearing/Vision screen Hearing Screening - Comments:: Denies hearing difficulties   Vision Screening - Comments:: Wears rx glasses - up to date with routine eye exams with [facility] in [place], [place] Immunizations and Health Maintenance Health Maintenance  Topic Date Due   COVID-19 Vaccine (1 - 2025-26 season) Never done  Pneumococcal Vaccine: 50+ Years (3 of 3 - PCV20 or PCV21) 04/10/2024   Zoster Vaccines- Shingrix (1 of 2) 03/16/2024 (Originally 02/04/2004)   Influenza Vaccine  04/26/2024 (Originally 08/28/2023)   FOOT EXAM  05/13/2024   Mammogram  05/15/2024   OPHTHALMOLOGY EXAM  06/08/2024   HEMOGLOBIN A1C  06/13/2024   Diabetic kidney evaluation - eGFR measurement  12/14/2024   Diabetic kidney evaluation - Urine ACR  12/14/2024   Medicare Annual Wellness (AWV)  01/11/2025   DTaP/Tdap/Td (3 - Td or Tdap) 10/21/2025   Fecal DNA (Cologuard)  05/26/2026   Bone Density Scan  Completed   Hepatitis C Screening  Completed   Meningococcal B Vaccine  Aged Out        Assessment/Plan:  This is a routine wellness examination for Jackqueline.  I have recommended that this patient have a immunization for Influenza and Pneumonia  but she declines at this time. I have discussed the risks and benefits of this procedure with her. The patient verbalizes understanding.   Patient Care Team: Joshua Debby CROME, MD as PCP - General (Internal Medicine) Rosalynn LELON Ingle, MD (Inactive) (Obstetrics and Gynecology)  I have personally reviewed and noted the following in the patients chart:   Medical and social history Use of alcohol , tobacco or illicit drugs  Current medications and supplements including opioid prescriptions. Functional ability and status Nutritional status Physical activity Advanced directives List of other physicians Hospitalizations, surgeries, and ER visits in previous 12 months Vitals Screenings to include cognitive,  depression, and falls Referrals and appointments  No orders of the defined types were placed in this encounter.  In addition, I have reviewed and discussed with patient certain preventive protocols, quality metrics, and best practice recommendations. A written personalized care plan for preventive services as well as general preventive health recommendations were provided to patient.   Verdie CHRISTELLA Saba, CMA   01/12/2024   Return in 1 year (on 01/11/2025).  After Visit Summary: (MyChart) Due to this being a telephonic visit, the after visit summary with patients personalized plan was offered to patient via MyChart   Nurse Notes: Appointment(s) made: (scheduled 2026 AWV/CPE appts)

## 2024-02-28 ENCOUNTER — Other Ambulatory Visit: Payer: Self-pay | Admitting: Internal Medicine

## 2024-02-28 DIAGNOSIS — E1159 Type 2 diabetes mellitus with other circulatory complications: Secondary | ICD-10-CM

## 2025-01-25 ENCOUNTER — Encounter: Admitting: Internal Medicine

## 2025-01-25 ENCOUNTER — Ambulatory Visit
